# Patient Record
Sex: Male | Born: 1955 | Race: Black or African American | Hispanic: No | Smoking: Former smoker
Health system: Southern US, Community
[De-identification: ages and names within clinical notes are randomized; demographics above are authoritative.]

## PROBLEM LIST (undated history)

## (undated) DIAGNOSIS — I10 Essential (primary) hypertension: Secondary | ICD-10-CM

## (undated) DIAGNOSIS — E119 Type 2 diabetes mellitus without complications: Secondary | ICD-10-CM

---

## 2022-01-01 ENCOUNTER — Inpatient Hospital Stay (HOSPITAL_COMMUNITY)
Admission: EM | Admit: 2022-01-01 | Discharge: 2022-01-09 | DRG: 065 | Disposition: A | Discharge status: Rehabilitation Facility | Payer: Medicaid - Out of State | Attending: Internal Medicine | Admitting: Internal Medicine

## 2022-01-01 ENCOUNTER — Encounter (HOSPITAL_COMMUNITY): Payer: Self-pay

## 2022-01-01 ENCOUNTER — Emergency Department (HOSPITAL_COMMUNITY): Payer: Medicaid - Out of State

## 2022-01-01 ENCOUNTER — Inpatient Hospital Stay (HOSPITAL_COMMUNITY): Payer: Medicaid - Out of State

## 2022-01-01 ENCOUNTER — Other Ambulatory Visit: Payer: Self-pay

## 2022-01-01 DIAGNOSIS — Z8249 Family history of ischemic heart disease and other diseases of the circulatory system: Secondary | ICD-10-CM | POA: Diagnosis not present

## 2022-01-01 DIAGNOSIS — E785 Hyperlipidemia, unspecified: Secondary | ICD-10-CM | POA: Diagnosis present

## 2022-01-01 DIAGNOSIS — W1830XA Fall on same level, unspecified, initial encounter: Secondary | ICD-10-CM | POA: Diagnosis present

## 2022-01-01 DIAGNOSIS — L84 Corns and callosities: Secondary | ICD-10-CM | POA: Diagnosis present

## 2022-01-01 DIAGNOSIS — Z79899 Other long term (current) drug therapy: Secondary | ICD-10-CM | POA: Diagnosis not present

## 2022-01-01 DIAGNOSIS — I639 Cerebral infarction, unspecified: Secondary | ICD-10-CM | POA: Diagnosis present

## 2022-01-01 DIAGNOSIS — Z716 Tobacco abuse counseling: Secondary | ICD-10-CM

## 2022-01-01 DIAGNOSIS — B351 Tinea unguium: Secondary | ICD-10-CM | POA: Diagnosis present

## 2022-01-01 DIAGNOSIS — F17210 Nicotine dependence, cigarettes, uncomplicated: Secondary | ICD-10-CM | POA: Diagnosis present

## 2022-01-01 DIAGNOSIS — E1165 Type 2 diabetes mellitus with hyperglycemia: Secondary | ICD-10-CM

## 2022-01-01 DIAGNOSIS — Z794 Long term (current) use of insulin: Secondary | ICD-10-CM

## 2022-01-01 DIAGNOSIS — Z20822 Contact with and (suspected) exposure to covid-19: Secondary | ICD-10-CM | POA: Diagnosis present

## 2022-01-01 DIAGNOSIS — C61 Malignant neoplasm of prostate: Secondary | ICD-10-CM | POA: Diagnosis present

## 2022-01-01 DIAGNOSIS — Z7982 Long term (current) use of aspirin: Secondary | ICD-10-CM

## 2022-01-01 DIAGNOSIS — I10 Essential (primary) hypertension: Secondary | ICD-10-CM | POA: Diagnosis present

## 2022-01-01 DIAGNOSIS — R1312 Dysphagia, oropharyngeal phase: Secondary | ICD-10-CM | POA: Diagnosis present

## 2022-01-01 DIAGNOSIS — R29810 Facial weakness: Secondary | ICD-10-CM | POA: Diagnosis present

## 2022-01-01 DIAGNOSIS — I6381 Other cerebral infarction due to occlusion or stenosis of small artery: Secondary | ICD-10-CM | POA: Diagnosis present

## 2022-01-01 DIAGNOSIS — R471 Dysarthria and anarthria: Secondary | ICD-10-CM | POA: Diagnosis present

## 2022-01-01 DIAGNOSIS — I6389 Other cerebral infarction: Secondary | ICD-10-CM | POA: Diagnosis not present

## 2022-01-01 DIAGNOSIS — I69354 Hemiplegia and hemiparesis following cerebral infarction affecting left non-dominant side: Secondary | ICD-10-CM | POA: Diagnosis not present

## 2022-01-01 DIAGNOSIS — Z28310 Unvaccinated for covid-19: Secondary | ICD-10-CM

## 2022-01-01 HISTORY — DX: Essential (primary) hypertension: I10

## 2022-01-01 HISTORY — DX: Type 2 diabetes mellitus without complications: E11.9

## 2022-01-01 LAB — CBC
HCT: 43 % (ref 39.0–52.0)
Hemoglobin: 14.1 g/dL (ref 13.0–17.0)
MCH: 28.3 pg (ref 26.0–34.0)
MCHC: 32.8 g/dL (ref 30.0–36.0)
MCV: 86.2 fL (ref 80.0–100.0)
Platelets: 239 10*3/uL (ref 150–400)
RBC: 4.99 MIL/uL (ref 4.22–5.81)
RDW: 12.7 % (ref 11.5–15.5)
WBC: 13.6 10*3/uL — ABNORMAL HIGH (ref 4.0–10.5)
nRBC: 0 % (ref 0.0–0.2)

## 2022-01-01 LAB — COMPREHENSIVE METABOLIC PANEL
ALT: 8 U/L (ref 0–44)
AST: 19 U/L (ref 15–41)
Albumin: 3.5 g/dL (ref 3.5–5.0)
Alkaline Phosphatase: 133 U/L — ABNORMAL HIGH (ref 38–126)
Anion gap: 12 (ref 5–15)
BUN: 7 mg/dL — ABNORMAL LOW (ref 8–23)
CO2: 25 mmol/L (ref 22–32)
Calcium: 9.3 mg/dL (ref 8.9–10.3)
Chloride: 99 mmol/L (ref 98–111)
Creatinine, Ser: 0.93 mg/dL (ref 0.61–1.24)
GFR, Estimated: >60 mL/min (ref >60)
Glucose, Bld: 387 mg/dL — ABNORMAL HIGH (ref 70–99)
Potassium: 4.4 mmol/L (ref 3.5–5.1)
Sodium: 136 mmol/L (ref 135–145)
Total Bilirubin: 0.8 mg/dL (ref 0.3–1.2)
Total Protein: 6.5 g/dL (ref 6.5–8.1)

## 2022-01-01 LAB — RESP PANEL BY RT-PCR (FLU A&B, COVID) ARPGX2
Influenza A by PCR: NEGATIVE
Influenza B by PCR: NEGATIVE
SARS Coronavirus 2 by RT PCR: NEGATIVE

## 2022-01-01 LAB — I-STAT VENOUS BLOOD GAS, ED
Acid-Base Excess: 4 mmol/L — ABNORMAL HIGH (ref 0.0–2.0)
Bicarbonate: 29.3 mmol/L — ABNORMAL HIGH (ref 20.0–28.0)
Calcium, Ion: 0.9 mmol/L — ABNORMAL LOW (ref 1.15–1.40)
HCT: 42 % (ref 39.0–52.0)
Hemoglobin: 14.3 g/dL (ref 13.0–17.0)
O2 Saturation: 70 %
Potassium: 4.4 mmol/L (ref 3.5–5.1)
Sodium: 138 mmol/L (ref 135–145)
TCO2: 31 mmol/L (ref 22–32)
pCO2, Ven: 45.2 mmHg (ref 44.0–60.0)
pH, Ven: 7.419 (ref 7.250–7.430)
pO2, Ven: 37 mmHg (ref 32.0–45.0)

## 2022-01-01 LAB — CBG MONITORING, ED
Glucose-Capillary: 381 mg/dL — ABNORMAL HIGH (ref 70–99)
Glucose-Capillary: 322 mg/dL — ABNORMAL HIGH (ref 70–99)
Glucose-Capillary: 299 mg/dL — ABNORMAL HIGH (ref 70–99)

## 2022-01-01 MED ORDER — CLOPIDOGREL BISULFATE 75 MG PO TABS
75.0000 mg | ORAL_TABLET | Freq: Every day | ORAL | Status: DC
  Administered 2022-01-03 – 2022-01-09 (×7): 75 mg via ORAL
  Filled 2022-01-01 (×8): qty 1

## 2022-01-01 MED ORDER — STROKE: EARLY STAGES OF RECOVERY BOOK
Freq: Once | Status: AC
  Filled 2022-01-01: qty 1

## 2022-01-01 MED ORDER — ENOXAPARIN SODIUM 40 MG/0.4ML IJ SOSY
40.0000 mg | PREFILLED_SYRINGE | INTRAMUSCULAR | Status: DC
  Administered 2022-01-01 – 2022-01-08 (×8): 40 mg via SUBCUTANEOUS
  Filled 2022-01-01 (×7): qty 0.4

## 2022-01-01 MED ORDER — GADOBUTROL 1 MMOL/ML IV SOLN
8.5000 mL | Freq: Once | INTRAVENOUS | Status: AC | PRN
  Administered 2022-01-01: 8.5 mL via INTRAVENOUS

## 2022-01-01 MED ORDER — ACETAMINOPHEN 160 MG/5ML PO SOLN: 650.0000 mg | ORAL | Status: DC | PRN

## 2022-01-01 MED ORDER — IOHEXOL 350 MG/ML SOLN
80.0000 mL | Freq: Once | INTRAVENOUS | Status: AC | PRN
  Administered 2022-01-01: 80 mL via INTRAVENOUS

## 2022-01-01 MED ORDER — ACETAMINOPHEN 325 MG PO TABS: 650.0000 mg | ORAL_TABLET | ORAL | Status: DC | PRN

## 2022-01-01 MED ORDER — SODIUM CHLORIDE 0.9 % IV BOLUS
500.0000 mL | Freq: Once | INTRAVENOUS | Status: AC
  Administered 2022-01-01: 500 mL via INTRAVENOUS

## 2022-01-01 MED ORDER — ACETAMINOPHEN 650 MG RE SUPP: 650.0000 mg | RECTAL | Status: DC | PRN

## 2022-01-01 MED ORDER — ASPIRIN EC 81 MG PO TBEC
81.0000 mg | DELAYED_RELEASE_TABLET | Freq: Every day | ORAL | Status: DC
  Filled 2022-01-01: qty 1

## 2022-01-01 MED ORDER — INSULIN ASPART 100 UNIT/ML IJ SOLN
0.0000 [IU] | Freq: Every day | INTRAMUSCULAR | Status: DC
  Administered 2022-01-01 – 2022-01-02 (×2): 3 [IU] via SUBCUTANEOUS

## 2022-01-01 MED ORDER — INSULIN GLARGINE-YFGN 100 UNIT/ML ~~LOC~~ SOLN
20.0000 [IU] | Freq: Every day | SUBCUTANEOUS | Status: DC
  Administered 2022-01-01: 20 [IU] via SUBCUTANEOUS
  Filled 2022-01-01 (×3): qty 0.2

## 2022-01-01 MED ORDER — SENNOSIDES-DOCUSATE SODIUM 8.6-50 MG PO TABS: 1.0000 | ORAL_TABLET | Freq: Every evening | ORAL | Status: DC | PRN

## 2022-01-01 MED ORDER — NICOTINE 21 MG/24HR TD PT24
21.0000 mg | MEDICATED_PATCH | Freq: Every day | TRANSDERMAL | Status: DC
  Administered 2022-01-01 – 2022-01-09 (×9): 21 mg via TRANSDERMAL
  Filled 2022-01-01 (×9): qty 1

## 2022-01-01 MED ORDER — INSULIN ASPART 100 UNIT/ML IJ SOLN
0.0000 [IU] | Freq: Three times a day (TID) | INTRAMUSCULAR | Status: DC
  Administered 2022-01-02: 3 [IU] via SUBCUTANEOUS
  Administered 2022-01-02: 2 [IU] via SUBCUTANEOUS
  Administered 2022-01-03: 3 [IU] via SUBCUTANEOUS
  Administered 2022-01-03: 7 [IU] via SUBCUTANEOUS

## 2022-01-01 NOTE — ED Notes (Signed)
Patient transported to CT 

## 2022-01-01 NOTE — Consult Note (Signed)
Neurology Consultation  Reason for Consult: stroke on MRI brain.  Referring Physician: Kerby Less, MD.   CC: strokes on MRI brain.   History is obtained from: patient, chart.   HPI: George Reyes. is a 66 y.o. 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 Patient presents to [facility] ED for weakness and a fall. He states he felt weak (diffusely) this am when he went to void. He fell in the bathroom and woke up his girlfriend. No LOC. Denies hitting his head. His nephew came and got him up, then they called 911.   States he was confused a bit yesterday. Felt like his memory was not so good.   His CBG was over 500 en route, down to 322 here. He is insulin dependent, takes ASA 81mg  daily and Lipitor. Upon questioning of patient, he had no slurred speech, aphasia, vision changes, increased weakness of a particular limb, dysphagia, facial droop, or numbness/tingling.   In review of chart, notes are only from [facility] and are related to orthopaedic surgery. No other notes in chart.   Because of stroke on MRI brain, neurology was asked to consult.   ROS: A robust ROS was performed and is negative except as noted in the HPI.    Past Medical History:  Diagnosis Date   Diabetes mellitus without complication ([place])    Hypertension   Stroke with sequelae of left sided weakness HLD Prostate cancer  Family History  Problem Relation Age of Onset   Heart attack Mother    Heart attack Father    Cancer Father    Social History:   reports that he has quit smoking. His smoking use included cigarettes. He has never used smokeless tobacco. He reports that he does not currently use alcohol. He reports that he does not currently use drugs.  Medications  Current Facility-Administered Medications:     stroke: mapping our early stages of recovery book, , Does not apply, Once, Madalyn Rob, MD   acetaminophen  (TYLENOL) tablet 650 mg, 650 mg, Oral, Q4H PRN **OR** acetaminophen (TYLENOL) 160 MG/5ML solution 650 mg, 650 mg, Per Tube, Q4H PRN **OR** acetaminophen (TYLENOL) suppository 650 mg, 650 mg, Rectal, Q4H PRN, Madalyn Rob, MD   aspirin EC tablet 81 mg, 81 mg, Oral, Daily, Kirby-Graham, Karsten Fells, NP   clopidogrel (PLAVIX) tablet 75 mg, 75 mg, Oral, Daily, Kirby-Graham, Karsten Fells, NP   enoxaparin (LOVENOX) injection 40 mg, 40 mg, Subcutaneous, Q24H, Madalyn Rob, MD   insulin aspart (novoLOG) injection 0-5 Units, 0-5 Units, Subcutaneous, QHS, Madalyn Rob, MD   insulin aspart (novoLOG) injection 0-9 Units, 0-9 Units, Subcutaneous, TID WC, Madalyn Rob, MD   insulin glargine-yfgn (SEMGLEE) injection 20 Units, 20 Units, Subcutaneous, QHS, Madalyn Rob, MD   nicotine (NICODERM CQ - dosed in mg/24 hours) patch 21 mg, 21 mg, Transdermal, Daily, Madalyn Rob, MD   senna-docusate (Senokot-S) tablet 1 tablet, 1 tablet, Oral, QHS PRN, Madalyn Rob, MD  Current Outpatient Medications:    Ascorbic Acid (VITAMIN C PO), Take 1 tablet by mouth daily., Disp: , Rfl:    aspirin EC 81 MG tablet, Take 81 mg by mouth every evening. Swallow whole., Disp: , Rfl:    B Complex Vitamins (VITAMIN B-COMPLEX PO), Take 1 tablet by mouth daily., Disp: , Rfl:    ibuprofen (ADVIL) 200 MG tablet, Take 200 mg by mouth every 6 (six) hours as  needed for fever, headache or mild pain., Disp: , Rfl:    LANTUS SOLOSTAR 100 UNIT/ML Solostar Pen, Inject 10 Units into the skin every evening., Disp: , Rfl:    VITAMIN A PO, Take 1 tablet by mouth daily., Disp: , Rfl:    VITAMIN D PO, Take 1 capsule by mouth daily., Disp: , Rfl:    XTANDI 40 MG capsule, Take 120 mg by mouth every evening., Disp: , Rfl:    Exam: Current vital signs: BP (!) 154/88    Pulse (!) 104    Temp 98.7 F (37.1 C)    Resp 18    SpO2 97%  Vital signs in last 24 hours: Temp:  [98.7 F (37.1 C)] 98.7 F (37.1 C) (01/03 1031) Pulse Rate:  [95-110] 104 (01/03  1600) Resp:  [14-20] 18 (01/03 1600) BP: (125-165)/(83-119) 154/88 (01/03 1600) SpO2:  [95 %-100 %] 97 % (01/03 1600)  PE: GENERAL: Fairly well appearing male lying in ED bed in NAD. Awake and alert.   HEENT: normocephalic and atraumatic. LUNGS - Normal respiratory effort.  CV - RRR on tele. ABDOMEN - Soft, nontender. Ext: warm, well perfused. Psych: affect light.    NEURO:  Mental Status: Alert and oriented x4. Speech/Language: speech is without dysarthria or aphasia.  Naming, repetition, fluency, and comprehension intact.  Cranial Nerves:  II: PERRL 2 mm/brisk. visual fields full.  III, IV, VI: EOMI. Lid elevation symmetric and full.  V: sensation is intact and symmetrical to face.  VII: Smile is symmetrical. VIII:hearing intact to voice. IX, X: palate elevation is symmetric. Phonation normal.  XI: normal sternocleidomastoid and trapezius muscle strength. QIO:NGEXBM is symmetrical without fasciculations.   Motor:  RUE: 5 throughout LUE: grips  4/5      triceps  5/5      biceps   5/5 RLE:  5/5 throughout.  LLE: 5/5 throughout except dorsi/plantar flexion which is 4/5.  Tone is normal. Bulk is normal.  Sensation- Intact to light touch bilaterally in all four extremities. Extinction absent to DSS.  Coordination: FTN intact bilaterally. HKS intact. LUE drift. .  Gait- deferred.  LKW last night.  TNK-no, outside window.  MRS-3  NIHSS:  1a Level of Consciousness: 0 1b LOC Questions:0  1c LOC Commands: 0 2 Best Gaze: 0 3 Visual: 0 4 Facial Palsy: 0 5a Motor Arm - left: 0 5b Motor Arm - Right: 0 6a Motor Leg - Left: 0 6b Motor Leg - Right: 0 7 Limb Ataxia: 0 8 Sensory: 0 9 Best Language: 0 10 Dysarthria: 0 11 Extinction and Inattention: 0 TOTAL: 0  Labs I have reviewed labs in epic and the results pertinent to this consultation are:  CBC    Component Value Date/Time   WBC 13.6 (H) 01/01/2022 1117   RBC 4.99 01/01/2022 1117   HGB 14.3 01/01/2022 1125    HCT 42.0 01/01/2022 1125   PLT 239 01/01/2022 1117   MCV 86.2 01/01/2022 1117   MCH 28.3 01/01/2022 1117   MCHC 32.8 01/01/2022 1117   RDW 12.7 01/01/2022 1117    CMP     Component Value Date/Time   NA 138 01/01/2022 1125   K 4.4 01/01/2022 1125   CL 99 01/01/2022 1117   CO2 25 01/01/2022 1117   GLUCOSE 387 (H) 01/01/2022 1117   BUN 7 (L) 01/01/2022 1117   CREATININE 0.93 01/01/2022 1117   CALCIUM 9.3 01/01/2022 1117   PROT 6.5 01/01/2022 1117   ALBUMIN 3.5 01/01/2022 1117  AST 19 01/01/2022 1117   ALT 8 01/01/2022 1117   ALKPHOS 133 (H) 01/01/2022 1117   BILITOT 0.8 01/01/2022 1117   GFRNONAA >60 01/01/2022 1117   Imaging MD reviewed the images obtained.  CT head No acute intracranial process.  CTA head and neck ordered.   MRI brain 1. Acute infarct in the right pons. Slight edema without mass effect. 2. Additional remote pontine infarcts, moderate chronic microvascular ischemic disease, and cerebral atrophy (ICD10-G31.9). 3. Multiple chronic microhemorrhages, described above and likely secondary to chronic hypertension.  Assessment: 66 yo male with stroke risk factors of prior stroke, HTN, HLD, and IDDM II. Imaging showed acute right pons infarct. He will be admitted for stroke workup. Should his CTA head and neck show atherosclerosis, he will need 90 total days of Plavix with ASA, then ASA alone.   Recommendations/Plan:  -Medicine admit. - HgbA1c with goal < 7%.  -check fasting lipid panel. If LDL over 70, increase Atorvastatin.  -CTA head and neck with and without contrast.  - Frequent neuro checks - follow NIHSS - Echocardiogram -Plavix 75mg  po qd x 21 days (if atherosclerosis on CTA, give for 90 days). -ASA 81mg  qd x 21 or 90 days, then ASA alone.  - Risk factor modification - cardiac telemetry monitoring for arrhythmia - PT consult, OT consult, Speech consult - stroke education. - Stroke team to follow   Pt seen by Clance Boll, NP/Neuro and  later by MD. Note/plan to be edited by MD as needed.  Pager: [phone]   Attending Neurohospitalist Addendum Patient seen and examined Agree with the history and physical as documented above. Agree with the plan as documented, which I helped formulate. I have independently reviewed the chart, obtained history, review of systems and examined the patient.I have personally reviewed pertinent head/neck/spine imaging (CT/MRI). Please feel free to call with any questions.  Patient has baseline L sided weakness with slight drift LUE and some movement against gravity LLE. He feels that he is now back to his baseline. Admit for workup acute R pontine stroke  --  Su Monks, MD [facility] [phone]  If 7pm- 7am, please page neurology on call as listed in [place].

## 2022-01-01 NOTE — H&P (Signed)
Date: 01/01/2022               Patient Name:  George Reyes. MRN: [id number]  DOB: 1956/02/14 Age / Sex: 66 y.o., male   PCP: Rosaura Carpenter, FNP         Medical Service: Internal Medicine Teaching Service         Attending Physician: Dr. Velna Ochs, MD    First Contact: Dr. Lorin Glass Pager: [phone]  Second Contact: Dr. Alfonse Spruce Pager: [phone]       After Hours (After 5p/  First Contact Pager: [phone]  weekends / holidays): Second Contact Pager: [phone]   Chief Complaint: Weakness, fall  History of Present Illness: George Reyes. is a 66 y.o. male with a PMHx of prostate cancer, type 2 DM on insulin, HLD, and prior CVA 10 years ago with residual left-sided weakness who presents to [place] today with chief complaint of weakness and a fall.  History was mainly provided by the patient.  He states that he started feeling weak this morning around 6 AM.  Describes this as diffuse weakness, same on both sides.  He subsequently fell but did not hit his head or injure any other part of his body.  Also has felt confused since yesterday.  Other complaints include an episode of shortness of breath yesterday after sitting up.  Denies numbness, tingling, headache, vision changes, hearing loss, chest pain, dysuria, hematuria, hematemesis, loss of consciousness, dizziness.  He lives out of town and seen by PCP Dr. Marcell Anger in [place].  Patient says that he had a stroke about 10 years ago and has had weakness on his left side ever since.  At that time, he presented with difficulty speaking.  Also endorses having all his teeth removed before being diagnosed with prostate cancer about 1 and half years ago.  No other complaints or concerns today.  Additional history from wife over the phone: Has been on therapy for prostate cancer. Started feeling weaker yesterday.  Review of Systems  Constitutional:  Negative for fever and malaise/fatigue.  HENT:  Negative for ear pain and tinnitus.   Eyes:   Negative for blurred vision and double vision.  Respiratory:  Negative for sputum production and shortness of breath.   Cardiovascular:  Negative for chest pain and leg swelling.  Gastrointestinal:  Negative for abdominal pain and diarrhea.  Musculoskeletal:  Positive for falls. Negative for myalgias.  Neurological:  Negative for speech change and focal weakness.  Psychiatric/Behavioral:  Negative for memory loss and substance abuse.    Meds:  Current Meds  Medication Sig   Ascorbic Acid (VITAMIN C PO) Take 1 tablet by mouth daily.   aspirin EC 81 MG tablet Take 81 mg by mouth every evening. Swallow whole.   B Complex Vitamins (VITAMIN B-COMPLEX PO) Take 1 tablet by mouth daily.   ibuprofen (ADVIL) 200 MG tablet Take 200 mg by mouth every 6 (six) hours as needed for fever, headache or mild pain.   LANTUS SOLOSTAR 100 UNIT/ML Solostar Pen Inject 10 Units into the skin every evening.   VITAMIN A PO Take 1 tablet by mouth daily.   VITAMIN D PO Take 1 capsule by mouth daily.   XTANDI 40 MG capsule Take 120 mg by mouth every evening.   Allergies: Allergies as of 01/01/2022   (No Known Allergies)   Past Medical History:  Diagnosis Date   Diabetes mellitus without complication ([place])    Hypertension  Family History: Mother and father both had heart attacks.  He states that his father had "some type of cancer," but does not know which kind.  Social History: He lives in [place], [place]. he has performed [employer] his whole life.  He is now retired and lives with his wife.  Does not exercise regularly.  Tobacco use 1ppd since a teenager, no alcohol. No drug use.   Review of Systems: A complete ROS was negative except as per HPI.   Physical Exam: Blood pressure (!) 134/43, pulse 87, temperature 98.7 F (37.1 C), resp. rate (!) 21, SpO2 100 %. Physical Exam Constitutional:      General: He is not in acute distress.    Appearance: Normal appearance.  HENT:     Head: Normocephalic and  atraumatic.  Eyes:     Extraocular Movements:     Right eye: Normal extraocular motion.     Pupils: Pupils are equal, round, and reactive to light.  Cardiovascular:     Rate and Rhythm: Regular rhythm. Tachycardia present.     Heart sounds: No murmur heard.   No friction rub. No gallop.  Pulmonary:     Effort: Pulmonary effort is normal.     Breath sounds: Normal breath sounds. No wheezing, rhonchi or rales.  Abdominal:     General: Abdomen is flat. There is no distension.     Palpations: Abdomen is soft.     Tenderness: There is no abdominal tenderness.  Skin:    General: Skin is warm and dry.  Neurological:     Mental Status: He is alert and oriented to person, place, and time.  Psychiatric:        Mood and Affect: Mood normal.        Behavior: Behavior normal.   Neurologic exam: Mental status: A&Ox3 Cranial Nerves:             II: PERRL             III, IV, VI: Extra-occular motions intact bilaterally             V, VII: Face symmetric, sensation intact in all 3 divisions               VIII: Hearing normal to rubbing fingers bilaterally               IX, X: Palate rises symmetrically             XI: Shoulder shrug normal bilaterally               XII: Tongue midline    Motor: Strength 5/5 on upper extremities, bulk muscle and tone are normal. Able to slide heels on the bed, Strength 3/5 BLE Sensory: Light touch intact and symmetric bilaterally  Coordination: There is some dysmetria on finger-to-nose on the left side.  EKG: personally reviewed my interpretation is sinus tachycardia  CXR: personally reviewed my interpretation is no evidence of acute cardiopulmonary disease  CT Head without Contrast: IMPRESSION: No acute intracranial process  MRI Brain W/WO Contrast:  IMPRESSION: 1. Acute infarct in the right pons. Slight edema without mass effect. 2. Additional remote pontine infarcts, moderate chronic microvascular ischemic disease, and cerebral atrophy  (ICD10-G31.9). 3. Multiple chronic microhemorrhages, described above and likely secondary to chronic hypertension.   Assessment & Plan by Problem: Principal Problem:   CVA (cerebral vascular accident) ([place])  George Shook Geremy Rister. is a 66 y.o. male with a past medical history of CVA with residual  left-sided weakness ~10 years ago, type 2 diabetes on insulin, HLD, and prostate cancer who is admitted for acute ischemic stroke of the right pons.  #Acute Ischemic Stroke  The patient has a history of prior stroke about 10 years ago and notes that he recently started an anticholesterol medication (unsure what is called) about a month ago.  Other past medical history includes type 2 diabetes on insulin (Lantus 20 units nightly).  He has also smoked a pack a day since he was a teenager.  In the ED, blood pressure was elevated to 165/119 initially, the patient denies personal history of hypertension.  Other notable work-up includes WBC 13.6, CBG of 381, negative head CT but MRI brain showing an acute infarct in the right pons as well as additional remote pontine infarcts. Outside of tPA window. Neurology team is on board, and we will follow their recommendations and perform further work-up as below.  - Neurology on board, appreciate their recs - CTAHead and neck pending - TTE pending  - DAPT for 90 days, followed by ASA alone - Atorvastatin 80 mg daily - BP goal: permissive HTN up to 220/120 mmHg - A1c and Lipid profile pending - Telemetry - Frequent neuro checks - NPO until passes stroke swallow screen - PT/OT/ST eval  #T2DM The patient has a history of type 2 diabetes for which he is on Lantus 20 units nightly at home. Most recent A1c in our system is 14.7 from November 2019. - Glargine 20 U nightly with SSI, QHS and AC - CBG monitoring  #HLD - Continue atorvastatin as above  #Tobacco use - Nicotine patch  Dispo: Admit patient to Observation with expected length of stay less than 2  midnights.  Signed: Orvis Brill, MD 01/01/2022, 5:43 PM  Pager: [phone]  After 5pm on weekdays and 1pm on weekends: On Call pager: [phone]

## 2022-01-01 NOTE — ED Provider Notes (Signed)
[facility] EMERGENCY DEPARTMENT Provider Note   CSN: [id number] Arrival date & time: 01/01/22  1024     History  Chief Complaint  Patient presents with   Cerebrovascular Accident    George Scarlet. is a 66 y.o. male.  HPI Initial history obtained from air care 66 year old male who is reported to have weakness for 2 days.  Brunilda Payor reports that they were called to transport patient from the scene.  They presented to [facility] at the patient's home.  They report that he had had weakness for 2 days with some garbled speech.  They report that he fell sometime in the past 24 hours.  They were called by local EMS to transport the patient to [facility].  They were unable to transport the patient to [facility] due to weather.  They transported the patient [facility] [place] [facility] due to [facility] problems.  He was transported by ground from [place] [facility] [place] to [facility] emergency department. During transport, patient's blood sugar reported to be high in the 500s   Patient states that he fell this morning and he does not have an injury.  He reports that he lives at home.  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  He has no complaints of headache, head injury, neck pain, chest pain or nausea, vomiting, diarrhea, or weakness.  He does states he has had some nasal congestion and coughing.  He reports that he has a history of prostate cancer and is currently being treated for this.  He has not had COVID or been vaccinated. Home Medications Prior to Admission medications   Medication Sig Start Date End Date Taking? Authorizing Provider  Ascorbic Acid (VITAMIN C PO) Take 1 tablet by mouth daily.   Yes [provider]  aspirin EC 81 MG tablet Take 81 mg by mouth every evening. Swallow whole.   Yes [provider]  B Complex Vitamins (VITAMIN B-COMPLEX PO) Take 1 tablet by mouth daily.    Yes [provider]  ibuprofen (ADVIL) 200 MG tablet Take 200 mg by mouth every 6 (six) hours as needed for fever, headache or mild pain.   Yes [provider]  LANTUS SOLOSTAR 100 UNIT/ML Solostar Pen Inject 10 Units into the skin every evening. 11/11/21  Yes [provider]  VITAMIN A PO Take 1 tablet by mouth daily.   Yes [provider]  VITAMIN D PO Take 1 capsule by mouth daily.   Yes [provider]  XTANDI 40 MG capsule Take 120 mg by mouth every evening. 12/03/21  Yes [provider]     Prostate cancer T2dm Long acting insulin  Allergies    Patient has no known allergies.    Review of Systems   Review of Systems  All other systems reviewed and are negative.  Physical Exam Updated Vital Signs BP 128/83    Pulse 98    Temp 98.7 F (37.1 C)    Resp 20    SpO2 100%  Physical Exam Vitals and nursing note reviewed.  Constitutional:      General: He is not in acute distress.    Appearance: Normal appearance. He is not ill-appearing.  HENT:     Head: Normocephalic.     Right Ear: External ear normal.     Left Ear: External ear normal.     Nose: Nose normal.     Mouth/Throat:  Comments: Mucous membranes appear dry Eyes:     Extraocular Movements: Extraocular movements intact.     Pupils: Pupils are equal, round, and reactive to light.  Cardiovascular:     Rate and Rhythm: Normal rate and regular rhythm.     Pulses: Normal pulses.  Pulmonary:     Effort: Pulmonary effort is normal.  Abdominal:     General: Abdomen is flat.     Palpations: Abdomen is soft.  Musculoskeletal:        General: Normal range of motion.     Cervical back: Normal range of motion and neck supple.  Skin:    General: Skin is warm and dry.     Capillary Refill: Capillary refill takes less than 2 seconds.  Neurological:     Mental Status: He is alert. Mental status is at baseline.     Comments: Mild facial asymmetry noted but no obvious  deficit No palmar drift noted Left leg is able to be held up against gravity for count of 5 with right lower extremity held up for count of 10 Patient states this is at baseline since prior stroke  Psychiatric:        Mood and Affect: Mood normal.        Behavior: Behavior normal.    ED Results / Procedures / Treatments   Labs (all labs ordered are listed, but only abnormal results are displayed) Labs Reviewed  CBC - Abnormal; Notable for the following components:      Result Value   WBC 13.6 (*)    All other components within normal limits  COMPREHENSIVE METABOLIC PANEL - Abnormal; Notable for the following components:   Glucose, Bld 387 (*)    BUN 7 (*)    Alkaline Phosphatase 133 (*)    All other components within normal limits  CBG MONITORING, ED - Abnormal; Notable for the following components:   Glucose-Capillary 381 (*)    All other components within normal limits  I-STAT VENOUS BLOOD GAS, ED - Abnormal; Notable for the following components:   Bicarbonate 29.3 (*)    Acid-Base Excess 4.0 (*)    Calcium, Ion 0.90 (*)    All other components within normal limits  CBG MONITORING, ED - Abnormal; Notable for the following components:   Glucose-Capillary 322 (*)    All other components within normal limits  RESP PANEL BY RT-PCR (FLU A&B, COVID) [id number]  URINALYSIS, ROUTINE W REFLEX MICROSCOPIC  BLOOD GAS, VENOUS    EKG None  Radiology CT Head Wo Contrast  Result Date: 01/01/2022 CLINICAL DATA:  Ground level fall, slurred speech, left-sided weakness EXAM: CT HEAD WITHOUT CONTRAST TECHNIQUE: Contiguous axial images were obtained from the base of the skull through the vertex without intravenous contrast. COMPARISON:  None. FINDINGS: Brain: No evidence of acute infarction, hemorrhage, cerebral edema, mass, mass effect, or midline shift. No hydrocephalus or extra-axial fluid collection. Vascular: No hyperdense vessel. Atherosclerotic calcifications in the intracranial carotid  and vertebral arteries. Skull: Normal. Negative for fracture or focal lesion. Sinuses/Orbits: Mucous retention cyst in the right maxillary sinus. Otherwise negative. The orbits are unremarkable. Other: The mastoid air cells are well aerated. IMPRESSION: IMPRESSION No acute intracranial process. Electronically Signed   By: Merilyn Baba M.D.   On: 01/01/2022 11:46   MR Brain W and Wo Contrast  Result Date: 01/01/2022 CLINICAL DATA:  Neuro deficit, acute, stroke suspected EXAM: MRI HEAD WITHOUT AND WITH CONTRAST TECHNIQUE: Multiplanar, multiecho pulse sequences of the brain and surrounding structures  were obtained without and with intravenous contrast. CONTRAST:  8.42mL GADAVIST GADOBUTROL 1 MMOL/ML IV SOLN COMPARISON:  Same day CT head. FINDINGS: Brain: Acute infarct in the right pons. Slight edema without mass effect. Additional patchy T2/FLAIR hyperintensity within the supratentorial and pontine white matter, nonspecific but compatible with chronic microvascular ischemic disease. Cerebral atrophy with ex vacuo ventricular dilation. Remote pontine infarcts. Multiple small foci of susceptibility artifact in bilateral basal ganglia, left thalamus and pons, likely chronic microhemorrhages. No evidence of acute hemorrhage. No hydrocephalus, extra-axial fluid collection, mass lesion or midline shift. No abnormal enhancement Vascular: Major arterial flow voids are maintained at the skull base. Skull and upper cervical spine: Normal marrow signal. Sinuses/Orbits: Minimal paranasal sinus mucosal thickening. Unremarkable orbits. Other: No mastoid effusions. IMPRESSION: 1. Acute infarct in the right pons. Slight edema without mass effect. 2. Additional remote pontine infarcts, moderate chronic microvascular ischemic disease, and cerebral atrophy (ICD10-G31.9). 3. Multiple chronic microhemorrhages, described above and likely secondary to chronic hypertension. Electronically Signed   By: Margaretha Sheffield M.D.   On:  01/01/2022 14:41   DG Chest Port 1 View  Result Date: 01/01/2022 CLINICAL DATA:  weakness cough EXAM: PORTABLE CHEST 1 VIEW COMPARISON:  None. FINDINGS: No consolidation. No visible pleural effusions or pneumothorax. Cardiomediastinal silhouette is within limits. IMPRESSION: No evidence of acute cardiopulmonary disease. Electronically Signed   By: Margaretha Sheffield M.D.   On: 01/01/2022 11:09    Procedures .Critical Care Performed by: Pattricia Boss, MD Authorized by: Pattricia Boss, MD   Critical care provider statement:    Critical care time (minutes):  60   Critical care was necessary to treat or prevent imminent or life-threatening deterioration of the following conditions:  CNS failure or compromise   Critical care was time spent personally by me on the following activities:  Development of treatment plan with patient or surrogate, discussions with consultants, evaluation of patient's response to treatment, examination of patient, ordering and review of laboratory studies, ordering and review of radiographic studies, ordering and performing treatments and interventions, pulse oximetry, re-evaluation of patient's condition and review of old charts    Medications Ordered in ED Medications  sodium chloride 0.9 % bolus 500 mL (500 mLs Intravenous Bolus 01/01/22 1124)  gadobutrol (GADAVIST) 1 MMOL/ML injection 8.5 mL (8.5 mLs Intravenous Contrast Given 01/01/22 1429)    ED Course/ Medical Decision Making/ A&P Clinical Course as of 01/01/22 1510  Tue Jan 01, 2022  1453 MRI reviewed acute infarct right pons noted [DR]    Clinical Course User Index [DR] Pattricia Boss, MD                           Medical Decision Making Amount and/or Complexity of Data Reviewed Independent Historian: spouse and EMS    Details: 66 yo male ho prostate ca well until yesterday, states takes oral meds for prostate ca, yesterday at waking up with nasal congestion , cough, wife gave zytec and aspirin.  Overnigh  last nigh he got up and fell.  She felt that speech was slurred this am yesterday but worse today and she had difficulty understanding him.  She does not think he had any other symptoms. Labs: ordered. Decision-making details documented in ED Course. Radiology: ordered. Decision-making details documented in ED Course. ECG/medicine tests: ordered and independent interpretation performed. Decision-making details documented in ED Course. Discussion of management or test interpretation with external provider(s): Patient presents today with report of garbled speech and fall.  Work-up here significant for mild leukocytosis No definite focal deficits noted on exam although his left leg was somewhat weak compared to right MRI obtained and shows acute focal infarct Discussed with Dr. Quinn Axe, neurology Discussed with Dr. Court Joy, on call for im teaching service     Final Clinical Impression(s) / ED Diagnoses Final diagnoses:  Cerebral infarction, unspecified mechanism [facility])    Rx / DC Orders ED Discharge Orders     None         Pattricia Boss, MD 01/01/22 1510

## 2022-01-01 NOTE — ED Triage Notes (Signed)
From [place] family called EMS because pt had ground level fall and not acting himself at home slurred speech and Left sided weakness for 2 days then helicopter was in route to another hospital and had land because of bad weather. Pt alert and oriented

## 2022-01-01 NOTE — ED Notes (Signed)
Admitting MD at bedside.

## 2022-01-02 ENCOUNTER — Observation Stay (HOSPITAL_COMMUNITY): Payer: Medicaid - Out of State

## 2022-01-02 DIAGNOSIS — I6389 Other cerebral infarction: Secondary | ICD-10-CM | POA: Diagnosis not present

## 2022-01-02 DIAGNOSIS — I639 Cerebral infarction, unspecified: Secondary | ICD-10-CM

## 2022-01-02 DIAGNOSIS — E1165 Type 2 diabetes mellitus with hyperglycemia: Secondary | ICD-10-CM | POA: Diagnosis not present

## 2022-01-02 DIAGNOSIS — I69354 Hemiplegia and hemiparesis following cerebral infarction affecting left non-dominant side: Secondary | ICD-10-CM

## 2022-01-02 LAB — ECHOCARDIOGRAM COMPLETE
AR max vel: 3.56 cm2
AV Area VTI: 3.74 cm2
AV Area mean vel: 3.62 cm2
AV Mean grad: 3 mmHg
AV Peak grad: 4.5 mmHg
Ao pk vel: 1.06 m/s
Calc EF: 56.4 %
S' Lateral: 2.46 cm
Single Plane A2C EF: 60.6 %
Single Plane A4C EF: 50.1 %

## 2022-01-02 LAB — LIPID PANEL
Cholesterol: 275 mg/dL — ABNORMAL HIGH (ref 0–200)
HDL: 39 mg/dL — ABNORMAL LOW
LDL Cholesterol: 177 mg/dL — ABNORMAL HIGH (ref 0–99)
Total CHOL/HDL Ratio: 7.1 ratio
Triglycerides: 294 mg/dL — ABNORMAL HIGH
VLDL: 59 mg/dL — ABNORMAL HIGH (ref 0–40)

## 2022-01-02 LAB — RAPID URINE DRUG SCREEN, HOSP PERFORMED
Amphetamines: NOT DETECTED
Barbiturates: NOT DETECTED
Benzodiazepines: NOT DETECTED
Cocaine: NOT DETECTED
Opiates: NOT DETECTED
Tetrahydrocannabinol: NOT DETECTED

## 2022-01-02 LAB — URINALYSIS, ROUTINE W REFLEX MICROSCOPIC
Bilirubin Urine: NEGATIVE
Glucose, UA: >=500 mg/dL — AB
Hgb urine dipstick: NEGATIVE
Ketones, ur: 15 mg/dL — AB
Leukocytes,Ua: NEGATIVE
Nitrite: NEGATIVE
Protein, ur: NEGATIVE mg/dL
Specific Gravity, Urine: 1.01 (ref 1.005–1.030)
pH: 6.5 (ref 5.0–8.0)

## 2022-01-02 LAB — HEMOGLOBIN A1C
Hgb A1c MFr Bld: 13.7 % — ABNORMAL HIGH (ref 4.8–5.6)
Mean Plasma Glucose: 346.49 mg/dL

## 2022-01-02 LAB — GLUCOSE, CAPILLARY
Glucose-Capillary: 192 mg/dL — ABNORMAL HIGH (ref 70–99)
Glucose-Capillary: 286 mg/dL — ABNORMAL HIGH (ref 70–99)

## 2022-01-02 LAB — URINALYSIS, MICROSCOPIC (REFLEX)
Bacteria, UA: NONE SEEN
RBC / HPF: NONE SEEN RBC/hpf (ref 0–5)

## 2022-01-02 LAB — CBG MONITORING, ED
Glucose-Capillary: 227 mg/dL — ABNORMAL HIGH (ref 70–99)
Glucose-Capillary: 262 mg/dL — ABNORMAL HIGH (ref 70–99)

## 2022-01-02 LAB — HIV ANTIBODY (ROUTINE TESTING W REFLEX): HIV Screen 4th Generation wRfx: NONREACTIVE

## 2022-01-02 MED ORDER — FOOD THICKENER (SIMPLYTHICK HONEY)
10.0000 | ORAL | Status: DC | PRN
  Filled 2022-01-02: qty 10

## 2022-01-02 MED ORDER — INSULIN GLARGINE-YFGN 100 UNIT/ML ~~LOC~~ SOLN
25.0000 [IU] | Freq: Every day | SUBCUTANEOUS | Status: DC
  Administered 2022-01-02: 25 [IU] via SUBCUTANEOUS
  Filled 2022-01-02 (×2): qty 0.25

## 2022-01-02 MED ORDER — ATORVASTATIN CALCIUM 80 MG PO TABS
80.0000 mg | ORAL_TABLET | Freq: Every day | ORAL | Status: DC
  Administered 2022-01-03 – 2022-01-09 (×7): 80 mg via ORAL
  Filled 2022-01-02 (×8): qty 1

## 2022-01-02 MED ORDER — LIVING WELL WITH DIABETES BOOK
Freq: Once | Status: DC
  Filled 2022-01-02: qty 1

## 2022-01-02 MED ORDER — INSULIN GLARGINE-YFGN 100 UNIT/ML ~~LOC~~ SOLN: 30.0000 [IU] | Freq: Every day | SUBCUTANEOUS | Status: DC

## 2022-01-02 NOTE — Progress Notes (Signed)
° °  Echocardiogram 2D Echocardiogram has been performed.  George Reyes 01/02/2022, 2:08 PM

## 2022-01-02 NOTE — Evaluation (Signed)
Physical Therapy Evaluation Patient Details Name: George Reyes. MRN: [id number] DOB: 1956/11/21 Today's Date: 01/02/2022  History of Present Illness  Pt is a 66 y/o male admitted following fall and L sided weakness. Found to have R pons infarct. PMH includes prostate cancer, DM, CVA with L weakness.  Clinical Impression  Pt admitted secondary to problem above with deficits below. Pt requiring mod A +2 to perform bed mobility and transfers. Required physical assist to move RLE to take side steps as pt with difficulty sequencing and with weakness. Pt previously at a mod I level with ambulation using a cane. Recommending acute inpatient rehab level therapies at d/c to increase independence and safety. Will continue to follow acutely.      Recommendations for follow up therapy are one component of a multi-disciplinary discharge planning process, led by the attending physician.  Recommendations may be updated based on patient status, additional functional criteria and insurance authorization.  Follow Up Recommendations Acute inpatient rehab (3hours/day)    Assistance Recommended at Discharge Frequent or constant Supervision/Assistance  Patient can return home with the following  Help with stairs or ramp for entrance;Assistance with cooking/housework;Two people to help with bathing/dressing/bathroom;Two people to help with walking and/or transfers    Equipment Recommendations Wheelchair cushion (measurements PT);Wheelchair (measurements PT)  Recommendations for Other Services       Functional Status Assessment Patient has had a recent decline in their functional status and demonstrates the ability to make significant improvements in function in a reasonable and predictable amount of time.     Precautions / Restrictions Precautions Precautions: Fall Restrictions Weight Bearing Restrictions: No      Mobility  Bed Mobility Overal bed mobility: Needs Assistance Bed Mobility: Supine to  Sit;Sit to Supine     Supine to sit: Mod assist;+2 for physical assistance Sit to supine: Mod assist;+2 for physical assistance   General bed mobility comments: Assist for trunk and LE assist. Also required assist to scoot hips to EOB    Transfers Overall transfer level: Needs assistance Equipment used: 2 person hand held assist Transfers: Sit to/from Stand Sit to Stand: Mod assist;+2 physical assistance           General transfer comment: Mod A +2 for lift assist and steadying assist. Pt with difficulty taking side steps at EOB. Required manual assist with weightshift and OT had to lift LLE to take a step.    Ambulation/Gait                  Stairs            Wheelchair Mobility    Modified Rankin (Stroke Patients Only) Modified Rankin (Stroke Patients Only) Pre-Morbid Rankin Score: Slight disability Modified Rankin: Moderately severe disability     Balance Overall balance assessment: Needs assistance Sitting-balance support: No upper extremity supported;Feet supported Sitting balance-Leahy Scale: Fair     Standing balance support: Bilateral upper extremity supported Standing balance-Leahy Scale: Poor Standing balance comment: Reliant on BUE support and external support                             Pertinent Vitals/Pain Pain Assessment: No/denies pain Faces Pain Scale: No hurt    Home Living Family/patient expects to be discharged to:: Private residence Living Arrangements: Spouse/significant other Available Help at Discharge: Family;Available 24 hours/day Type of Home: House Home Access: Stairs to enter Entrance Stairs-Rails: Right Entrance Stairs-Number of Steps: 4   Home  Layout: One level Home Equipment: Cane - single point;Toilet riser      Prior Function Prior Level of Function : Needs assist             Mobility Comments: Uses cane for ambulation ADLs Comments: Girlfriend supervises for bathing     Hand  Dominance   Dominant Hand: Right    Extremity/Trunk Assessment   Upper Extremity Assessment Upper Extremity Assessment: Defer to OT evaluation    Lower Extremity Assessment Lower Extremity Assessment: LLE deficits/detail LLE Deficits / Details: Grossly 2+/5 throughout. Reporting numbness on outer part of L thigh    Cervical / Trunk Assessment Cervical / Trunk Assessment: Normal  Communication   Communication: No difficulties  Cognition Arousal/Alertness: Awake/alert Behavior During Therapy: WFL for tasks assessed/performed Overall Cognitive Status: No family/caregiver present to determine baseline cognitive functioning                                 General Comments: Slow processing noted. Pt reporting conflicting information throughout session Functional Status Assessment: Patient has had a recent decline in their functional status and demonstrates the ability to make significant improvements in function in a reasonable and predictable amount of time.      General Comments      Exercises     Assessment/Plan    PT Assessment Patient needs continued PT services  PT Problem List Decreased strength;Decreased activity tolerance;Decreased balance;Decreased mobility;Decreased knowledge of use of DME;Decreased knowledge of precautions;Decreased safety awareness;Decreased cognition       PT Treatment Interventions DME instruction;Gait training;Stair training;Therapeutic exercise;Therapeutic activities;Functional mobility training;Balance training;Cognitive remediation;Patient/family education;Neuromuscular re-education    PT Goals (Current goals can be found in the Care Plan section)  Acute Rehab PT Goals Patient Stated Goal: to get rehab before going home PT Goal Formulation: With patient Time For Goal Achievement: 01/16/22 Potential to Achieve Goals: Good    Frequency Min 4X/week     Co-evaluation PT/OT/SLP Co-Evaluation/Treatment: Yes Reason for  Co-Treatment: To address functional/ADL transfers;For patient/therapist safety PT goals addressed during session: Mobility/safety with mobility;Balance OT goals addressed during session: ADL's and self-care       AM-PAC PT "6 Clicks" Mobility  Outcome Measure Help needed turning from your back to your side while in a flat bed without using bedrails?: A Lot Help needed moving from lying on your back to sitting on the side of a flat bed without using bedrails?: Total Help needed moving to and from a bed to a chair (including a wheelchair)?: Total Help needed standing up from a chair using your arms (e.g., wheelchair or bedside chair)?: Total Help needed to walk in hospital room?: Total Help needed climbing 3-5 steps with a railing? : Total 6 Click Score: 7    End of Session Equipment Utilized During Treatment: Gait belt Activity Tolerance: Patient tolerated treatment well Patient left: in bed;with call bell/phone within reach (on stretcher in ED) Nurse Communication: Mobility status PT Visit Diagnosis: Unsteadiness on feet (R26.81);Muscle weakness (generalized) (M62.81)    Time: 2706-2376 PT Time Calculation (min) (ACUTE ONLY): 23 min   Charges:   PT Evaluation $PT Eval Moderate Complexity: 1 Mod          Reuel Derby, PT, DPT  Acute Rehabilitation Services  Pager: [phone] Office: [phone]   Rudean Hitt 01/02/2022, 11:32 AM

## 2022-01-02 NOTE — Evaluation (Signed)
Clinical/Bedside Swallow Evaluation Patient Details  Name: George Reyes. MRN: [id number] Date of Birth: 1956-10-12  Today's Date: 01/02/2022 Time: SLP Start Time (ACUTE ONLY): 1003 SLP Stop Time (ACUTE ONLY): 1017 SLP Time Calculation (min) (ACUTE ONLY): 14 min  Past Medical History:  Past Medical History:  Diagnosis Date   Diabetes mellitus without complication ([place])    Hypertension    Past Surgical History: History reviewed. No pertinent surgical history. HPI:  George Duman. is a 66 y.o. male who presented to [facility] ED for weakness and a fall. He states he felt weak (diffusely) this am when he went to void. He fell in the bathroom and woke up his girlfriend. No LOC. Denies hitting his head.  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    Assessment / Plan / Recommendation  Clinical Impression  Pt presents with clinical indicators of pharyngeal dysphagia in setting of new R pontine CVA. Pt has hx of prior stroke with residual L weakness.  It is unclear if L facial droop is baseline or new onset with current CVA.  Pt is edentulous but states that he eats most foods at baseline as long as they are cooked soft.  Pt exhibited cough with thin liquid by straw with liquid wash for regular solid.  Swallow was at times seemingly delayed without cough reflex noted.  With regular solid there was prolonged oral phase and oral residue, mostly located on hard palate.  Location of CVA with increased risk for silent aspiration.    Recommend MBSS prior to initiation of PO diet.  MBS planned for later this date.  Pt with dysarthric speech noted.  He is largely intelligible, but has noted changed in speech.  When speaking with food in mouth, pt could not be understood well.  Consider placing orders for speech-language evaluation.    SLP Visit Diagnosis:  Dysphagia, unspecified (R13.10)    Aspiration Risk  Moderate aspiration risk    Diet Recommendation NPO   Medication Administration: Via alternative means    Other  Recommendations  Consider a speech-language and/or cognitive-linguistic evaluation   Recommendations for follow up therapy are one component of a multi-disciplinary discharge planning process, led by the attending physician.  Recommendations may be updated based on patient status, additional functional criteria and insurance authorization.  Follow up Recommendations  (TBD)      Assistance Recommended at Discharge  (TBD)  Functional Status Assessment Patient has had a recent decline in their functional status and demonstrates the ability to make significant improvements in function in a reasonable and predictable amount of time.  Frequency and Duration  (TBD)   (TBD)       Prognosis   TBD     Swallow Study   General HPI: George Reyes. is a 66 y.o. male who presented to [facility] ED for weakness and a fall. He states he felt weak (diffusely) this am when he went to void. He fell in the bathroom and woke up his girlfriend. No LOC. Denies hitting his head.  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 Type of Study: Bedside Swallow Evaluation Previous Swallow Assessment: None documented Diet  Prior to this Study: NPO Temperature Spikes Noted: No Respiratory Status: Room air History of Recent Intubation: No Behavior/Cognition: Cooperative;Alert;Pleasant mood Oral Cavity Assessment: Within Functional Limits Oral Care Completed by SLP: No Oral Cavity - Dentition: Edentulous Vision: Functional for self-feeding Self-Feeding Abilities: Able to feed self Patient Positioning: Upright in bed Baseline Vocal Quality: Normal Volitional Cough:  (Fair) Volitional Swallow: Unable  to elicit    Oral/Motor/Sensory Function Overall Oral Motor/Sensory Function: Moderate impairment Facial ROM: Reduced left Facial Symmetry: Abnormal symmetry right Lingual ROM: Within Functional Limits Lingual Symmetry: Within Functional Limits Lingual Strength: Reduced Velum: Within Functional Limits Mandible: Within Functional Limits   Ice Chips Ice chips: Not tested   Thin Liquid Thin Liquid: Impaired Presentation: Cup;Straw Pharyngeal  Phase Impairments: Suspected delayed Swallow;Cough - Immediate    Nectar Thick Nectar Thick Liquid: Not tested   Honey Thick Honey Thick Liquid: Not tested   Puree Puree: Within functional limits Presentation: Spoon   Solid     Solid: Impaired Oral Phase Functional Implications: Prolonged oral transit;Impaired mastication;Oral residue Pharyngeal Phase Impairments: Cough - Delayed      Celedonio Savage, MA, [place] Office: [phone] 01/02/2022,10:39 AM

## 2022-01-02 NOTE — ED Notes (Signed)
Speech notified that pt was not in the room when coming to get for evaluation will have to reschedule

## 2022-01-02 NOTE — Progress Notes (Signed)
Inpatient Diabetes Program Recommendations  AACE/ADA: New Consensus Statement on Inpatient Glycemic Control (2015)  Target Ranges:  Prepandial:   less than 140 mg/dL      Peak postprandial:   less than 180 mg/dL (1-2 hours)      Critically ill patients:  140 - 180 mg/dL   Lab Results  Component Value Date   GLUCAP 262 (H) 01/02/2022   HGBA1C 13.7 (H) 01/02/2022    Review of Glycemic Control  Latest Reference Range & Units 01/01/22 11:17 01/01/22 14:59 01/01/22 21:37 01/02/22 10:23 01/02/22 12:26  Glucose-Capillary 70 - 99 mg/dL 381 (H) 322 (H) 299 (H) 227 (H) 262 (H)   Diabetes history: DM 2 Outpatient Diabetes medications: Lantus 10 units daily Current orders for Inpatient glycemic control:  Novolog sensitive tid with meals and HS Semglee 20 units daily  Inpatient Diabetes Program Recommendations:    Agree with current orders.  Discussed A1C with patient of 13.7%.  He states that he has been taking insulin as ordered but was not taking oral DM agent.  Discussed importance of Glycemic control to prevent complications and how current A1C indicates uncontrolled DM. Told patient that MD's would likely adjust DM medications while he is in the hospital to optimize blood sugars as well.  He states that DM is managed by the [place].   Discussed importance of checking blood sugars, taking medications and goal blood sugars of 100-180 mg/dL.  Patient did not report any low blood sugars at home.   Will follow.  Note potential plans for Rehab.  Will follow.   Thanks,  Adah Perl, RN, BC-ADM Inpatient Diabetes Coordinator Pager [phone]  (8a-5p)

## 2022-01-02 NOTE — Evaluation (Signed)
Occupational Therapy Evaluation Patient Details Name: George Reyes. MRN: [id number] DOB: 1956-03-25 Today's Date: 01/02/2022   History of Present Illness George Cauble. is a 66 y.o. male who presented to the ED 1/3 after a fall and weakness while attempting to ambulate to the bathroom. 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 He lives in a 1 level home with 4 STE, with his s/o who can assist as needed. Upon evaluation pt required mod A for bed mobility and for sit<>Stand, and max A +2 for side stepping at the bed side. He currently requires up to max A +2 for OOB ADLs. He is currently limited by L weakness, impaired coordination, balance and cognition. He will benefit from OT acutely. Recommend CIR at d/c to progress towards his mod I baseline prior to d/c home.      Recommendations for follow up therapy are one component of a multi-disciplinary discharge planning process, led by the attending physician.  Recommendations may be updated based on patient status, additional functional criteria and insurance authorization.   Follow Up Recommendations  Acute inpatient rehab (3hours/day)    Assistance Recommended at Discharge    Patient can return home with the following A lot of help with walking and/or transfers;Two people to help with walking and/or transfers;A lot of help with bathing/dressing/bathroom;Two people to help with bathing/dressing/bathroom;Direct supervision/assist for medications management;Assist for transportation;Help with stairs or ramp for entrance    Functional Status Assessment  Patient has had a recent decline in their functional status and demonstrates the ability to make significant improvements in function in a reasonable and  predictable amount of time.  Equipment Recommendations  BSC/3in1;Tub/shower seat;Wheelchair (measurements OT);Wheelchair cushion (measurements OT) (RW)    Recommendations for Other Services Rehab consult     Precautions / Restrictions Precautions Precautions: Fall Restrictions Weight Bearing Restrictions: No      Mobility Bed Mobility Overal bed mobility: Needs Assistance Bed Mobility: Supine to Sit;Sit to Supine     Supine to sit: Mod assist;+2 for physical assistance Sit to supine: Mod assist;+2 for physical assistance   General bed mobility comments: Assist for trunk and LE assist. Also required assist to scoot hips to EOB    Transfers Overall transfer level: Needs assistance Equipment used: 2 person hand held assist Transfers: Sit to/from Stand Sit to Stand: Mod assist;+2 physical assistance           General transfer comment: Mod A +2 for lift assist and steadying assist. Pt with difficulty taking side steps at EOB. Required manual assist with weightshift and OT had to lift LLE to take a step.      Balance Overall balance assessment: Needs assistance Sitting-balance support: No upper extremity supported;Feet supported Sitting balance-Leahy Scale: Fair     Standing balance support: Bilateral upper extremity supported Standing balance-Leahy Scale: Poor Standing balance comment: Reliant on BUE support and external support                           ADL either performed or assessed with clinical judgement   ADL Overall ADL's : Needs assistance/impaired Eating/Feeding: Set up;Sitting   Grooming: Supervision/safety;Set up;Sitting   Upper Body Bathing: Supervision/ safety;Set up;Sitting  Lower Body Bathing: Maximal assistance;Sit to/from stand   Upper Body Dressing : Supervision/safety;Set up;Sitting   Lower Body Dressing: Maximal assistance;Sit to/from stand   Toilet Transfer: Maximal assistance;+2 for physical assistance;+2 for  safety/equipment;Ambulation;Stand-pivot   Toileting- Clothing Manipulation and Hygiene: Maximal assistance;+2 for safety/equipment;+2 for physical assistance;Sit to/from stand       Functional mobility during ADLs: Maximal assistance;+2 for physical assistance;+2 for safety/equipment General ADL Comments: pt with difficulty coordinating LLE when standing, required max A +2 for all OOB tasks. benefits from cues to initiate ans sequence through tasks     Vision Baseline Vision/History: 0 No visual deficits Ability to See in Adequate Light: 0 Adequate Patient Visual Report: No change from baseline Vision Assessment?: Vision impaired- to be further tested in functional context Additional Comments: difficult to obtain accurate assessment, pt not follow commands. initially not blinking to threat on L eye. Seemingly impaired L peripheral visual field - needs to be further assessed            Pertinent Vitals/Pain Pain Assessment: No/denies pain Faces Pain Scale: No hurt     Hand Dominance Right   Extremity/Trunk Assessment Upper Extremity Assessment Upper Extremity Assessment: LUE deficits/detail LUE Deficits / Details: impaired AROM, and globally 4/5 MMT throughout LUE. deminished sensation reported in distal LUE, ulnar aspect>radial. Able to use funcitonally with increased time and effort LUE Sensation: decreased light touch LUE Coordination: decreased fine motor;decreased gross motor   Lower Extremity Assessment Lower Extremity Assessment: Defer to PT evaluation LLE Deficits / Details: Grossly 2+/5 throughout. Reporting numbness on outer part of L thigh   Cervical / Trunk Assessment Cervical / Trunk Assessment: Normal   Communication Communication Communication: No difficulties   Cognition Arousal/Alertness: Awake/alert Behavior During Therapy: WFL for tasks assessed/performed Overall Cognitive Status: No family/caregiver present to determine baseline cognitive functioning                                  General Comments: Slow processing noted. Pt reporting conflicting information throughout session     General Comments  VSS on RA    Exercises     Shoulder Instructions      Home Living Family/patient expects to be discharged to:: Private residence Living Arrangements: Spouse/significant other Available Help at Discharge: Family;Available 24 hours/day Type of Home: House Home Access: Stairs to enter [employer] of Steps: 4 Entrance Stairs-Rails: Right Home Layout: One level     Bathroom Shower/Tub: Tub/shower unit;Walk-in shower   Bathroom Toilet: Standard     Home Equipment: Cane - single point;Toilet riser          Prior Functioning/Environment Prior Level of Function : Needs assist             Mobility Comments: Uses cane for ambulation ADLs Comments: Girlfriend supervises for bathing        OT Problem List: Decreased strength;Decreased range of motion;Decreased activity tolerance;Impaired balance (sitting and/or standing);Decreased coordination;Decreased cognition;Decreased safety awareness;Decreased knowledge of use of DME or AE;Decreased knowledge of precautions;Impaired UE functional use      OT Treatment/Interventions: Self-care/ADL training;Therapeutic exercise;DME and/or AE instruction;Therapeutic activities;Patient/family education;Balance training    OT Goals(Current goals can be found in the care plan section) Acute Rehab OT Goals Patient Stated Goal: did not state OT Goal Formulation: With patient Time For Goal Achievement: 01/16/22 Potential to Achieve Goals: Good ADL Goals Pt Will Perform Grooming: with modified independence;standing Pt Will Perform Lower Body Bathing:  with modified independence;sit to/from stand Pt Will Perform Lower Body Dressing: with modified independence;sit to/from stand Pt Will Transfer to Toilet: with modified independence;ambulating Additional ADL Goal #1: pt  will indep verbalized at least 3 fall prevention strategies to apply in the home setting  OT Frequency: Min 2X/week    Co-evaluation PT/OT/SLP Co-Evaluation/Treatment: Yes Reason for Co-Treatment: To address functional/ADL transfers;For patient/therapist safety PT goals addressed during session: Mobility/safety with mobility;Balance OT goals addressed during session: ADL's and self-care      AM-PAC OT "6 Clicks" Daily Activity     Outcome Measure Help from another person eating meals?: A Little Help from another person taking care of personal grooming?: A Little Help from another person toileting, which includes using toliet, bedpan, or urinal?: A Lot Help from another person bathing (including washing, rinsing, drying)?: A Lot Help from another person to put on and taking off regular upper body clothing?: A Little Help from another person to put on and taking off regular lower body clothing?: A Lot 6 Click Score: 15   End of Session Equipment Utilized During Treatment: Gait belt Nurse Communication: Mobility status  Activity Tolerance: Patient tolerated treatment well Patient left: in bed;with call bell/phone within reach  OT Visit Diagnosis: Unsteadiness on feet (R26.81);Other abnormalities of gait and mobility (R26.89);Muscle weakness (generalized) (M62.81);History of falling (Z91.81);Apraxia (R48.2);Hemiplegia and hemiparesis Hemiplegia - Right/Left: Left Hemiplegia - dominant/non-dominant: Non-Dominant Hemiplegia - caused by: Cerebral infarction                Time: 6834-1962 OT Time Calculation (min): 23 min Charges:  OT General Charges $OT Visit: 1 Visit OT Evaluation $OT Eval Moderate Complexity: 1 Mod  Yee Joss A Alianys Chacko 01/02/2022, 11:42 AM

## 2022-01-02 NOTE — Progress Notes (Signed)
Subjective: The patient was seen at bedside during rounds this AM. He does not endorse any new complaints today.  He says that his speech has been more garbled since the weakness began.  His left-sided weakness has been the same since his stroke 10 years ago.  He has T2DM for which he is on insulin at home.  He has tried metformin in the past but states that his other doctor took him off it a while ago because he did not like it.  He has not tried any other oral medications.  No other complaints or concerns today.  Objective:  Vital signs in last 24 hours: Vitals:   01/02/22 0100 01/02/22 0200 01/02/22 0400 01/02/22 0600  BP: (!) 170/93 (!) 141/70 (!) 167/95 (!) 158/104  Pulse: 95 (!) 103 (!) 102 (!) 108  Resp: (!) 21 20 18 17   Temp:      TempSrc:      SpO2: 98% 100% 98% 100%    Physical Exam Constitutional:      General: He is not in acute distress.    Appearance: Normal appearance.  HENT:     Head: Normocephalic and atraumatic.  Eyes:     Extraocular Movements:     Right eye: Normal extraocular motion.     Pupils: Pupils are equal, round, and reactive to light.  Cardiovascular:     Rate and Rhythm: Regular rate and rhythm.    Heart sounds: No murmur heard.   No friction rub. No gallop.  Pulmonary:     Effort: Pulmonary effort is normal.     Breath sounds: Normal breath sounds. No wheezing, rhonchi or rales.  Abdominal:     General: Abdomen is flat. There is no distension.     Palpations: Abdomen is soft.     Tenderness: There is no abdominal tenderness.  Skin:    General: Skin is warm and dry.  Neurological:     Mental Status: He is alert and oriented to person, place, and time.  Psychiatric:        Mood and Affect: Mood normal.        Behavior: Behavior normal.    Neurologic exam: Mental status: A&Ox3 Cranial Nerves:             II: PERRL             III, IV, VI: Extra-occular motions intact bilaterally             V: Sensation intact in all 3 divisions                IX, X: Palate rises symmetrically             XI: Shoulder shrug normal bilaterally               XII: Tongue midline    Motor: Strength 5/5 in right upper extremity, 4/5 in LUE (unchanged per patient). bulk muscle and tone are normal. Able to slide heels on the bed, Strength 2/5 in BLE. Sensory: Light touch intact and symmetric bilaterally  Coordination: There is some dysmetria on finger-to-nose on the left side.   Assessment/Plan:  Principal Problem:   CVA (cerebral vascular accident) ([place])  #Acute Ischemic Stroke  MRI brain on admit showed an acute infarct in the right pons as well as additional remote pontine infarcts. Pt with other risk factors including current smoker, poorly controlled DM, HLD, prior stroke. CT angio did not show significant stenoses. LDL 171. Pt did fail  swallow eval this AM, dysarthric on exam. Orders already placed for sppech-language eval. PT/OT recommending CIR. Neurology team is on board, and we will follow their recommendations and perform further work-up as below.  - Neurology on board, appreciate their recs - TTE pending  - DAPT for 90 days, followed by ASA alone - Atorvastatin 80 mg daily - BP goal: permissive HTN up to 220/120 mmHg - Telemetry - Frequent neuro checks - NPO until passes stroke swallow screen - Speech-language eval/barium swallow pending   #T2DM The patient has a history of type 2 diabetes for which he is on Lantus 20 units nightly at home. A1c of 13.7.  - Glargine 20 U nightly with SSI, QHS and AC  - CBG monitoring  #HLD - Continue atorvastatin as above   #Tobacco use - Nicotine patch  Prior to Admission Living Arrangement: Home Anticipated Discharge Location: CIR Dispo: Anticipated discharge pending medical workup, medical stability, and CIR placement  Orvis Brill, MD 01/02/2022, 7:47 AM Pager: [phone]  After 5pm on weekdays and 1pm on weekends: On Call pager [phone]

## 2022-01-02 NOTE — ED Notes (Signed)
Pt incontinent of stool and urine. Brief and linens changed. Primofit placed, will obtain urine sample when clean sample is available.

## 2022-01-02 NOTE — Progress Notes (Signed)
Inpatient Rehab Admissions Coordinator:  ? ?Per therapy recommendations,  patient was screened for CIR candidacy by Javad Salva, MS, CCC-SLP. At this time, Pt. Appears to be a a potential candidate for CIR. I will place   order for rehab consult per protocol for full assessment. Please contact me any with questions. ? ?Gabino Hagin, MS, CCC-SLP ?Rehab Admissions Coordinator  ?[phone] (celll) ?[phone] (office) ? ?

## 2022-01-02 NOTE — Progress Notes (Signed)
Speech Language Pathology Treatment: Dysphagia  Patient Details Name: George Reyes. MRN: [id number] DOB: 03-03-1956 Today's Date: 01/02/2022 Time: 7628-3151 SLP Time Calculation (min) (ACUTE ONLY): 16 min  Assessment / Plan / Recommendation Clinical Impression  Pt had bedside swallow eval this am with recs for NPO and MBS. Unfortunately, when transport arrive to take him to radiology he was out of the room for another procedure and missed the window for the swallow study.  Tonight his performance is slightly improved.  His left facial asymmetry appears improved since this am. He tolerated 4 ounces of pudding with no concern for aspiration.  Thin liquids elicited immediate cough. Honey thick liquids led to wet sounding voice.  Recommend allowing dysphagia 1 diet and PUDDING thick liquids tonight pending MBS next date. George Reyes agrees with plan.   HPI HPI: George Reyes. is a 66 y.o. male who presented to [facility] ED for weakness and a fall. He states he felt weak (diffusely) this am when he went to void. He fell in the bathroom and woke up his girlfriend. No LOC. Denies hitting his head.  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      SLP Plan  Continue with current plan of care      Recommendations for follow up therapy are one component of a multi-disciplinary discharge planning process, led by the attending physician.  Recommendations may be updated based on patient status, additional functional criteria and insurance authorization.    Recommendations  Diet recommendations: Dysphagia 1 (puree); -pudding thick liquids Liquids provided via: Teaspoon Medication Administration: Crushed with puree Postural Changes and/or Swallow Maneuvers: Seated upright 90 degrees                Plan: Continue with current plan of care          George Reyes, [place] CCC/SLP Acute Rehabilitation Services Office number [phone] Pager [phone]   Assunta Curtis  01/02/2022, 6:12 PM

## 2022-01-02 NOTE — Discharge Summary (Signed)
Name: George Reyes. MRN: [id number] DOB: 1956/12/20 66 y.o. PCP: Rosaura Carpenter, FNP  Date of Admission: 01/01/2022 10:24 AM Date of Discharge: 01/09/2022 Attending Physician: Velna Ochs, MD  Discharge Diagnosis: 1.  Acute ischemic stroke, right pontine; history of CVA 10 years ago 2.  Poorly controlled type 2 diabetes 3.  Hyperlipidemia 4.  Tobacco use, willing to quit  Discharge Medications: Allergies as of 01/09/2022   No Known Allergies      Medication List     STOP taking these medications    aspirin EC 81 MG tablet Replaced by: aspirin 81 MG chewable tablet   ibuprofen 200 MG tablet Commonly known as: ADVIL   Lantus SoloStar 100 UNIT/ML Solostar Pen Generic drug: insulin glargine   VITAMIN A PO   VITAMIN B-COMPLEX PO   VITAMIN C PO   VITAMIN D PO   Xtandi 40 MG capsule Generic drug: enzalutamide       TAKE these medications    acetaminophen 325 MG tablet Commonly known as: TYLENOL Take 2 tablets (650 mg total) by mouth every 4 (four) hours as needed for mild pain (or temp > 37.5 C (99.5 F)).   acetaminophen 160 MG/5ML solution Commonly known as: TYLENOL Place 20.3 mLs (650 mg total) into feeding tube every 4 (four) hours as needed for mild pain (or temp > 37.5 C (99.5 F)).   acetaminophen 650 MG suppository Commonly known as: TYLENOL Place 1 suppository (650 mg total) rectally every 4 (four) hours as needed for mild pain (or temp > 37.5 C (99.5 F)).   aspirin 81 MG chewable tablet Chew 1 tablet (81 mg total) by mouth daily. Replaces: aspirin EC 81 MG tablet   atorvastatin 80 MG tablet Commonly known as: LIPITOR Take 1 tablet (80 mg total) by mouth daily.   clopidogrel 75 MG tablet Commonly known as: PLAVIX Take 1 tablet (75 mg total) by mouth daily.   enoxaparin 40 MG/0.4ML injection Commonly known as: LOVENOX Inject 0.4 mLs (40 mg total) into the skin daily.   food thickener Liqd Commonly known as: SIMPLYTHICK  (HONEY/LEVEL 3/MODERATELY THICK) Take 10 packets by mouth as needed.   insulin aspart 100 UNIT/ML injection Commonly known as: novoLOG Inject 0-15 Units into the skin 3 (three) times daily with meals.   insulin aspart 100 UNIT/ML injection Commonly known as: novoLOG Inject 8 Units into the skin 3 (three) times daily with meals.   insulin glargine-yfgn 100 UNIT/ML injection Commonly known as: SEMGLEE Inject 0.33 mLs (33 Units total) into the skin at bedtime.   nicotine 21 mg/24hr patch Commonly known as: NICODERM CQ - dosed in mg/24 hours Place 1 patch (21 mg total) onto the skin daily.   senna-docusate 8.6-50 MG tablet Commonly known as: Senokot-S Take 1 tablet by mouth at bedtime as needed for mild constipation.        Disposition and follow-up:   George P Leys Jr. was discharged from [facility] in Good condition.  At the hospital follow up visit please address:  1.  Acute ischemic stroke: Likely secondary to small vessel disease.  The patient started dual antiplatelet therapy with aspirin and Plavix on 01/05.  He will continue these 2 medications until 1/26, when he will switch to only taking Plavix alone.  Given his difficulty swallowing on admit, we started dysphagia 1 diet on 1/5, and we administered his medications in crushed form. Explored option of giving Crestor sprinkle, however this medication was unable to be found  in the [facility] system and may be quite expensive for the patient. If patient develops difficulty swallowing, can consider cortrak placement for nutrition.  2. Diabetic Foot Wound, Onychomycosis  Large callus over the plantar aspect of his right foot that he reports is chronic, does not appear infected but needs debridement. He will need outpatient podiatry follow-up for this.  3.  Pending labs/ test needing follow-up: None  Follow-up Appointments:   Hospital Course by problem list: 1. Acute ischemic stroke, right pons:  The  patient has a history of prior stroke about 10 years ago and notes that he recently started an anticholesterol medication (unsure what is called) about a month ago.  Other past medical history includes type 2 diabetes on insulin (Lantus 20 units nightly).  He has also smoked a pack a day since he was a teenager.  The patient presented here due to history of weakness and a fall.  In the ED, blood pressure was elevated to 165/119 initially, the patient denied personal history of hypertension.  Other notable work-up includes WBC 13.6, CBG of 381, negative head CT but MRI brain showing an acute infarct in the right pons as well as additional remote pontine infarcts. Outside of tPA window.   Neurology team was consulted, and we followed their recommendations during his hospital stay: DAPT with ASA and Plavix was started. Allowed for permissive HTN for 48 hours. CT angio did not show significant stenoses. LDL 171. A1c of 13.7. Telemetry monitored and did not reveal findings of afib during hospital stay. TTE performed in the hospital was unremarkable. Thought to be likely secondary to small vessel disease.  PT/OT evaluated the patient and recommended CIR.  Main concern prior to eventual discharge to CIR was patient's dysphagia.  On admit, he was noted to be dysarthric with a slight facial droop.  He failed his swallow screen the same day and continued to be n.p.o. until 1/5.  Patient's barium swallow performed the day showed a few instances of silent aspiration but with overall fairly good control of boluses.  After barium swallow, patient did have an episode of emesis, but this did not recur and there were otherwise no issues.  On 1/6, the patient endorsed tolerating his dinner and breakfast without choking episodes.  A cortrak was initially considered but was not given because patient was tolerating p.o. intake well on 1/6. He continued to tolerate PO intake throughout his hospitalization.  In terms of medical  therapy, patient was able to tolerate crushed p.o. medications on 1/5.  This included his aspirin and Plavix.  According to [facility], patient received atorvastatin as well after transitioning to dysphagia 1 diet.  Explored option of giving Crestor sprinkle, however this medication was unable to be found in the [facility] system and may be quite expensive.   Plan is for patient to continue aspirin and Plavix for a total of 3 weeks until January 26th when he will switch to taking Plavix alone.  Diabetes managed as below.  High intensity statin.  Patient was counseled to quit smoking completely and he seemed agreeable to this during his hospitalization.   #Diabetic Foot Wound #Onychomycosis  Large callus over the plantar aspect of his right foot that he reports is chronic, does not appear infected but needs debridement. He will need outpatient podiatry follow-up for this.    #Poorly controlled type 2 diabetes The patient has a history of type 2 diabetes for which he is on Lantus 20 units nightly at home.  A1c of 13.7 on admit.  Patient was managed with nightly and mealtime insulin, and his CBGs were monitored throughout his hospital stay. Oral medications were considered but were held given his dysphagia.  #Hyperlipidemia Continued atorvastatin during this hospitalization when the patient was able to swallow.  #Tobacco use The patient endorses smoking 1 pack/day since he was a teenager.  On admit, he was placed on nicotine patch.  This was continued throughout his hospitalization.  He was also counseled on smoking cessation, to which he seemed agreeable.  Subjective: Patient evaluated at bedside this AM. He reports improvement in weakness, but he is not quite back to his baseline.  Discharge Exam:   BP 137/84 (BP Location: Right Arm)    Pulse 78    Temp 98 F (36.7 C)    Resp 18    Ht 6\' 2"  (1.88 m)    Wt 81.8 kg    SpO2 97%    BMI 23.15 kg/m  Physical Exam Constitutional:      General: He is not  in acute distress.    Appearance: Normal appearance.  HENT:     Head: Normocephalic and atraumatic.  Eyes:     Extraocular Movements:     Right eye: Normal extraocular motion.     Pupils: Pupils are equal, round, and reactive to light.  Cardiovascular:     Rate and Rhythm: Regular rate and rhythm.    Heart sounds: No murmur heard.   No friction rub. No gallop.  Pulmonary:     Effort: Pulmonary effort is normal.     Breath sounds: Normal breath sounds. No wheezing, rhonchi or rales.  Abdominal:     General: Abdomen is flat. There is no distension.     Palpations: Abdomen is soft.     Tenderness: There is no abdominal tenderness.  Skin:    General: Skin is warm and dry.  Neurological:     Mental Status: He is alert and oriented to person, place, and time.  Psychiatric:        Mood and Affect: Mood normal.        Behavior: Behavior normal.    Neurologic exam: Mental status: A&Ox3 Cranial Nerves:             II: PERRL             III, IV, VI: Extra-occular motions intact bilaterally             V, VII: Sensation intact in all 3 divisions, left facial droop             IX, X: Palate rises symmetrically             XI: Shoulder shrug normal bilaterally               XII: Tongue midline    Motor: Strength 5/5 in right upper extremity, 4/5 in LUE. Strength 4/5 in BLE. Bulk muscle and tone are normal.  Sensory: Light touch intact and symmetric bilaterally   Pertinent Labs, Studies, and Procedures:  CT ANGIO HEAD NECK W WO CM  Result Date: 01/01/2022 CLINICAL DATA:  Stroke on prior MRI, slurred speech EXAM: CT ANGIOGRAPHY HEAD AND NECK TECHNIQUE: Multidetector CT imaging of the head and neck was performed using the standard protocol during bolus administration of intravenous contrast. Multiplanar CT image reconstructions and MIPs were obtained to evaluate the vascular anatomy. Carotid stenosis measurements (when applicable) are obtained utilizing NASCET criteria, using the distal  internal carotid diameter  as the denominator. CONTRAST:  56mL OMNIPAQUE IOHEXOL 350 MG/ML SOLN COMPARISON:  No prior CTA, correlation is made with CT head 01/01/2022 FINDINGS: CT HEAD FINDINGS For noncontrast findings, please see same day CT head. CTA NECK FINDINGS Aortic arch: Two-vessel arch with a common origin of the brachiocephalic and left common carotid arteries. Imaged portion shows no evidence of aneurysm or dissection. No significant stenosis of the major arch vessel origins. Aortic atherosclerosis. Right carotid system: No evidence of dissection, stenosis (50% or greater) or occlusion. Calcified plaque at the bifurcation and in the proximal right ICA, which is not hemodynamically significant. Left carotid system: No evidence of dissection, stenosis (50% or greater) or occlusion. Calcified plaque at the bifurcation and in the proximal left ICA, which is not hemodynamically significant. Vertebral arteries: Right dominant, with diminutive left vertebral artery. Moderate narrowing at the origin of the left vertebral artery, secondary to calcified and noncalcified plaque. No evidence of dissection, other stenosis stenosis (50% or greater) or occlusion. Skeleton: No acute osseous abnormality. Other neck: Multiple subcentimeter hypoattenuating nodules in the left greater than right thyroid lobe. Upper chest: Negative. Review of the MIP images confirms the above findings CTA HEAD FINDINGS Anterior circulation: Both internal carotid arteries are patent to the termini, with mild narrowing in the bilateral cavernous carotids, secondary to calcified plaque. A1 segments patent. Normal anterior communicating artery. Anterior cerebral arteries are patent to their distal aspects. No M1 stenosis or occlusion. Normal MCA bifurcations. Distal MCA branches perfused and symmetric. Posterior circulation: Vertebral arteries patent to the vertebrobasilar junction without stenosis. Mild calcification in the left V4. Patent right  posteroinferior cerebral artery. The left PICA is not definitively visualized. Basilar patent to its distal aspect. Superior cerebellar arteries patent bilaterally. Bilateral P1 segments originate from the basilar artery. PCAs perfused to their distal aspects without stenosis. Diminutive right posterior communicating artery is visualized. The left posterior communicating artery is not definitively seen. Venous sinuses: As permitted by contrast timing, patent. Anatomic variants: None significant Review of the MIP images confirms the above findings IMPRESSION: 1. Moderate focal stenosis at the origin of the left vertebral artery, which is diminutive and non dominant. The left PICA is not definitively visualized. 2. Calcified plaque at the bifurcation and in the proximal bilateral ICAs, which is not hemodynamically significant. 3. Mild narrowing of the bilateral cavernous carotids, secondary to calcified plaque, which is unlikely to be hemodynamically significant. No intracranial large vessel occlusion. Electronically Signed   By: Merilyn Baba M.D.   On: 01/01/2022 19:19   CT Head Wo Contrast  Result Date: 01/01/2022 CLINICAL DATA:  Ground level fall, slurred speech, left-sided weakness EXAM: CT HEAD WITHOUT CONTRAST TECHNIQUE: Contiguous axial images were obtained from the base of the skull through the vertex without intravenous contrast. COMPARISON:  None. FINDINGS: Brain: No evidence of acute infarction, hemorrhage, cerebral edema, mass, mass effect, or midline shift. No hydrocephalus or extra-axial fluid collection. Vascular: No hyperdense vessel. Atherosclerotic calcifications in the intracranial carotid and vertebral arteries. Skull: Normal. Negative for fracture or focal lesion. Sinuses/Orbits: Mucous retention cyst in the right maxillary sinus. Otherwise negative. The orbits are unremarkable. Other: The mastoid air cells are well aerated. IMPRESSION: IMPRESSION No acute intracranial process. Electronically  Signed   By: Merilyn Baba M.D.   On: 01/01/2022 11:46   MR Brain W and Wo Contrast  Result Date: 01/01/2022 CLINICAL DATA:  Neuro deficit, acute, stroke suspected EXAM: MRI HEAD WITHOUT AND WITH CONTRAST TECHNIQUE: Multiplanar, multiecho pulse sequences of the brain  and surrounding structures were obtained without and with intravenous contrast. CONTRAST:  8.31mL GADAVIST GADOBUTROL 1 MMOL/ML IV SOLN COMPARISON:  Same day CT head. FINDINGS: Brain: Acute infarct in the right pons. Slight edema without mass effect. Additional patchy T2/FLAIR hyperintensity within the supratentorial and pontine white matter, nonspecific but compatible with chronic microvascular ischemic disease. Cerebral atrophy with ex vacuo ventricular dilation. Remote pontine infarcts. Multiple small foci of susceptibility artifact in bilateral basal ganglia, left thalamus and pons, likely chronic microhemorrhages. No evidence of acute hemorrhage. No hydrocephalus, extra-axial fluid collection, mass lesion or midline shift. No abnormal enhancement Vascular: Major arterial flow voids are maintained at the skull base. Skull and upper cervical spine: Normal marrow signal. Sinuses/Orbits: Minimal paranasal sinus mucosal thickening. Unremarkable orbits. Other: No mastoid effusions. IMPRESSION: 1. Acute infarct in the right pons. Slight edema without mass effect. 2. Additional remote pontine infarcts, moderate chronic microvascular ischemic disease, and cerebral atrophy (ICD10-G31.9). 3. Multiple chronic microhemorrhages, described above and likely secondary to chronic hypertension. Electronically Signed   By: Margaretha Sheffield M.D.   On: 01/01/2022 14:41   DG Chest Port 1 View  Result Date: 01/01/2022 CLINICAL DATA:  weakness cough EXAM: PORTABLE CHEST 1 VIEW COMPARISON:  None. FINDINGS: No consolidation. No visible pleural effusions or pneumothorax. Cardiomediastinal silhouette is within limits. IMPRESSION: No evidence of acute cardiopulmonary  disease. Electronically Signed   By: Margaretha Sheffield M.D.   On: 01/01/2022 11:09   ECHOCARDIOGRAM COMPLETE  Result Date: 01/02/2022    ECHOCARDIOGRAM REPORT   Patient Name:   Jamarion [place]. Date of Exam: 01/02/2022 Medical Rec #:  [id number]        Height: Accession #:    [id number]       Weight: Date of Birth:  1956-04-22        BSA: Patient Age:    55 years         BP:           117/90 mmHg Patient Gender: M                HR:           115 bpm. Exam Location:  Inpatient Procedure: 2D Echo, Cardiac Doppler and Color Doppler Indications:    stroke  History:        Patient has no prior history of Echocardiogram examinations.                 Risk Factors:Hypertension and Diabetes.  Sonographer:    Beryle Beams Referring Phys: [place]  1. Technically difficult study with poor visualization.  2. Left ventricular ejection fraction, by estimation, is 55 to 60%. The left ventricle has normal function. Left ventricular endocardial border not optimally defined to evaluate regional wall motion. There is mild concentric left ventricular hypertrophy. Left ventricular diastolic parameters are consistent with Grade I diastolic dysfunction (impaired relaxation).  3. Right ventricular systolic function is normal. The right ventricular size is normal.  4. The mitral valve is grossly normal. Trivial mitral valve regurgitation.  5. The aortic valve was not well visualized. Aortic valve regurgitation is not visualized. Aortic valve sclerosis is present, with no evidence of aortic valve stenosis. Comparison(s): No prior Echocardiogram. Conclusion(s)/Recommendation(s): No intracardiac source of embolism detected on this transthoracic study. Consider a transesophageal echocardiogram to exclude cardiac source of embolism if clinically indicated. FINDINGS  Left Ventricle: Left ventricular ejection fraction, by estimation, is 55 to 60%. The left ventricle has normal function. Left ventricular endocardial  border not  optimally defined to evaluate regional wall motion. The left ventricular internal cavity size was normal in size. There is mild concentric left ventricular hypertrophy. Left ventricular diastolic parameters are consistent with Grade I diastolic dysfunction (impaired relaxation). Right Ventricle: The right ventricular size is normal. Right vetricular wall thickness was not well visualized. Right ventricular systolic function is normal. Left Atrium: Left atrial size was normal in size. Right Atrium: Right atrial size was normal in size. Pericardium: There is no evidence of pericardial effusion. Mitral Valve: The mitral valve is grossly normal. There is mild thickening of the mitral valve leaflet(s). There is mild calcification of the mitral valve leaflet(s). Mild mitral annular calcification. Trivial mitral valve regurgitation. Tricuspid Valve: The tricuspid valve is normal in structure. Tricuspid valve regurgitation is trivial. Aortic Valve: The aortic valve was not well visualized. Aortic valve regurgitation is not visualized. Aortic valve sclerosis is present, with no evidence of aortic valve stenosis. Aortic valve mean gradient measures 3.0 mmHg. Aortic valve peak gradient measures 4.5 mmHg. Aortic valve area, by VTI measures 3.74 cm. Pulmonic Valve: The pulmonic valve was not well visualized. Aorta: The aortic root was not well visualized. Venous: The inferior vena cava was not well visualized. IAS/Shunts: The atrial septum is grossly normal.  LEFT VENTRICLE PLAX 2D LVIDd:         3.48 cm     Diastology LVIDs:         2.46 cm     LV e' medial:  8.08 cm/s LV PW:         1.05 cm     LV e' lateral: 9.48 cm/s LV IVS:        1.00 cm LVOT diam:     2.30 cm LV SV:         75 LVOT Area:     4.15 cm  LV Volumes (MOD) LV vol d, MOD A2C: 81.0 ml LV vol d, MOD A4C: 66.8 ml LV vol s, MOD A2C: 31.9 ml LV vol s, MOD A4C: 33.3 ml LV SV MOD A2C:     49.1 ml LV SV MOD A4C:     66.8 ml LV SV MOD BP:      42.3 ml RIGHT VENTRICLE  RV S prime:     17.70 cm/s TAPSE (M-mode): 1.6 cm LEFT ATRIUM           RIGHT ATRIUM LA diam:      3.20 cm RA Area:     12.10 cm LA Vol (A4C): 17.6 ml  AORTIC VALVE AV Area (Vmax):    3.56 cm AV Area (Vmean):   3.62 cm AV Area (VTI):     3.74 cm AV Vmax:           106.00 cm/s AV Vmean:          74.300 cm/s AV VTI:            0.201 m AV Peak Grad:      4.5 mmHg AV Mean Grad:      3.0 mmHg LVOT Vmax:         90.90 cm/s LVOT Vmean:        64.700 cm/s LVOT VTI:          0.181 m LVOT/AV VTI ratio: 0.90  AORTA Ao Root diam: 3.20 cm Ao Asc diam:  3.00 cm  SHUNTS Systemic VTI:  0.18 m Systemic Diam: 2.30 cm Gwyndolyn Kaufman MD Electronically signed by Gwyndolyn Kaufman MD Signature Date/Time: 01/02/2022/5:15:44 PM  Final     CBC Latest Ref Rng & Units 01/05/2022 01/04/2022 01/01/2022  WBC 4.0 - 10.5 K/uL 7.5 8.5 -  Hemoglobin 13.0 - 17.0 g/dL 12.8(L) 13.6 14.3  Hematocrit 39.0 - 52.0 % 39.0 40.0 42.0  Platelets 150 - 400 K/uL 235 230 -   BMP Latest Ref Rng & Units 01/06/2022 01/05/2022 01/04/2022  Glucose 70 - 99 mg/dL 132(H) 172(H) 145(H)  BUN 8 - 23 mg/dL 22 22 19   Creatinine 0.61 - 1.24 mg/dL 0.97 0.86 0.83  Sodium 135 - 145 mmol/L 136 137 136  Potassium 3.5 - 5.1 mmol/L 3.9 3.7 3.1(L)  Chloride 98 - 111 mmol/L 101 101 101  CO2 22 - 32 mmol/L 27 27 26   Calcium 8.9 - 10.3 mg/dL 8.9 8.8(L) 8.6(L)   CBG (last 3)  Recent Labs    01/08/22 1654 01/08/22 2125 01/09/22 0618  GLUCAP 150* 156* 119*   LDL 171 A1c 13.7   Signed: Orvis Brill, MD 01/09/2022, 8:09 AM   Pager: [phone]

## 2022-01-02 NOTE — ED Notes (Signed)
Speech, OT, PT working with pt.  Pt did not pass swallow eval for speech further testing needed

## 2022-01-02 NOTE — Progress Notes (Addendum)
STROKE TEAM PROGRESS NOTE   INTERVAL HISTORY No family at the bedside. Patient is hemodynamically stable. Patient states that he does not take any medication for his cholesterol but that he is using his insulin for his diabetes. PT and OT at the bedside to work with the patient.  MRI scan shows right paramedian pontine lacunar infarct and old lacune's.  CT angiogram shows no significant large vessel extracranial intracranial stenosis.  LDL cholesterol is elevated 177 mg percent and hemoglobin A1c at 13.7. Vitals:   01/02/22 0200 01/02/22 0400 01/02/22 0600 01/02/22 0700  BP: (!) 141/70 (!) 167/95 (!) 158/104 (!) 139/94  Pulse: (!) 103 (!) 102 (!) 108 (!) 104  Resp: 20 18 17 16   Temp:      TempSrc:      SpO2: 100% 98% 100% 100%   CBC:  Recent Labs  Lab 01/01/22 1117 01/01/22 1125  WBC 13.6*  --   HGB 14.1 14.3  HCT 43.0 42.0  MCV 86.2  --   PLT 239  --    Basic Metabolic Panel:  Recent Labs  Lab 01/01/22 1117 01/01/22 1125  NA 136 138  K 4.4 4.4  CL 99  --   CO2 25  --   GLUCOSE 387*  --   BUN 7*  --   CREATININE 0.93  --   CALCIUM 9.3  --    Lipid Panel:  Recent Labs  Lab 01/02/22 0249  CHOL 275*  TRIG 294*  HDL 39*  CHOLHDL 7.1  VLDL 59*  LDLCALC 177*   HgbA1c:  Recent Labs  Lab 01/02/22 0249  HGBA1C 13.7*   Urine Drug Screen: No results for input(s): LABOPIA, COCAINSCRNUR, LABBENZ, AMPHETMU, THCU, LABBARB in the last 168 hours.  Alcohol Level No results for input(s): ETH in the last 168 hours.  IMAGING past 24 hours CT ANGIO HEAD NECK W WO CM  Result Date: 01/01/2022 CLINICAL DATA:  Stroke on prior MRI, slurred speech EXAM: CT ANGIOGRAPHY HEAD AND NECK TECHNIQUE: Multidetector CT imaging of the head and neck was performed using the standard protocol during bolus administration of intravenous contrast. Multiplanar CT image reconstructions and MIPs were obtained to evaluate the vascular anatomy. Carotid stenosis measurements (when applicable) are obtained  utilizing NASCET criteria, using the distal internal carotid diameter as the denominator. CONTRAST:  55mL OMNIPAQUE IOHEXOL 350 MG/ML SOLN COMPARISON:  No prior CTA, correlation is made with CT head 01/01/2022 FINDINGS: CT HEAD FINDINGS For noncontrast findings, please see same day CT head. CTA NECK FINDINGS Aortic arch: Two-vessel arch with a common origin of the brachiocephalic and left common carotid arteries. Imaged portion shows no evidence of aneurysm or dissection. No significant stenosis of the major arch vessel origins. Aortic atherosclerosis. Right carotid system: No evidence of dissection, stenosis (50% or greater) or occlusion. Calcified plaque at the bifurcation and in the proximal right ICA, which is not hemodynamically significant. Left carotid system: No evidence of dissection, stenosis (50% or greater) or occlusion. Calcified plaque at the bifurcation and in the proximal left ICA, which is not hemodynamically significant. Vertebral arteries: Right dominant, with diminutive left vertebral artery. Moderate narrowing at the origin of the left vertebral artery, secondary to calcified and noncalcified plaque. No evidence of dissection, other stenosis stenosis (50% or greater) or occlusion. Skeleton: No acute osseous abnormality. Other neck: Multiple subcentimeter hypoattenuating nodules in the left greater than right thyroid lobe. Upper chest: Negative. Review of the MIP images confirms the above findings CTA HEAD FINDINGS Anterior circulation:  Both internal carotid arteries are patent to the termini, with mild narrowing in the bilateral cavernous carotids, secondary to calcified plaque. A1 segments patent. Normal anterior communicating artery. Anterior cerebral arteries are patent to their distal aspects. No M1 stenosis or occlusion. Normal MCA bifurcations. Distal MCA branches perfused and symmetric. Posterior circulation: Vertebral arteries patent to the vertebrobasilar junction without stenosis. Mild  calcification in the left V4. Patent right posteroinferior cerebral artery. The left PICA is not definitively visualized. Basilar patent to its distal aspect. Superior cerebellar arteries patent bilaterally. Bilateral P1 segments originate from the basilar artery. PCAs perfused to their distal aspects without stenosis. Diminutive right posterior communicating artery is visualized. The left posterior communicating artery is not definitively seen. Venous sinuses: As permitted by contrast timing, patent. Anatomic variants: None significant Review of the MIP images confirms the above findings IMPRESSION: 1. Moderate focal stenosis at the origin of the left vertebral artery, which is diminutive and non dominant. The left PICA is not definitively visualized. 2. Calcified plaque at the bifurcation and in the proximal bilateral ICAs, which is not hemodynamically significant. 3. Mild narrowing of the bilateral cavernous carotids, secondary to calcified plaque, which is unlikely to be hemodynamically significant. No intracranial large vessel occlusion. Electronically Signed   By: Merilyn Baba M.D.   On: 01/01/2022 19:19   CT Head Wo Contrast  Result Date: 01/01/2022 CLINICAL DATA:  Ground level fall, slurred speech, left-sided weakness EXAM: CT HEAD WITHOUT CONTRAST TECHNIQUE: Contiguous axial images were obtained from the base of the skull through the vertex without intravenous contrast. COMPARISON:  None. FINDINGS: Brain: No evidence of acute infarction, hemorrhage, cerebral edema, mass, mass effect, or midline shift. No hydrocephalus or extra-axial fluid collection. Vascular: No hyperdense vessel. Atherosclerotic calcifications in the intracranial carotid and vertebral arteries. Skull: Normal. Negative for fracture or focal lesion. Sinuses/Orbits: Mucous retention cyst in the right maxillary sinus. Otherwise negative. The orbits are unremarkable. Other: The mastoid air cells are well aerated. IMPRESSION: IMPRESSION No  acute intracranial process. Electronically Signed   By: Merilyn Baba M.D.   On: 01/01/2022 11:46   MR Brain W and Wo Contrast  Result Date: 01/01/2022 CLINICAL DATA:  Neuro deficit, acute, stroke suspected EXAM: MRI HEAD WITHOUT AND WITH CONTRAST TECHNIQUE: Multiplanar, multiecho pulse sequences of the brain and surrounding structures were obtained without and with intravenous contrast. CONTRAST:  8.101mL GADAVIST GADOBUTROL 1 MMOL/ML IV SOLN COMPARISON:  Same day CT head. FINDINGS: Brain: Acute infarct in the right pons. Slight edema without mass effect. Additional patchy T2/FLAIR hyperintensity within the supratentorial and pontine white matter, nonspecific but compatible with chronic microvascular ischemic disease. Cerebral atrophy with ex vacuo ventricular dilation. Remote pontine infarcts. Multiple small foci of susceptibility artifact in bilateral basal ganglia, left thalamus and pons, likely chronic microhemorrhages. No evidence of acute hemorrhage. No hydrocephalus, extra-axial fluid collection, mass lesion or midline shift. No abnormal enhancement Vascular: Major arterial flow voids are maintained at the skull base. Skull and upper cervical spine: Normal marrow signal. Sinuses/Orbits: Minimal paranasal sinus mucosal thickening. Unremarkable orbits. Other: No mastoid effusions. IMPRESSION: 1. Acute infarct in the right pons. Slight edema without mass effect. 2. Additional remote pontine infarcts, moderate chronic microvascular ischemic disease, and cerebral atrophy (ICD10-G31.9). 3. Multiple chronic microhemorrhages, described above and likely secondary to chronic hypertension. Electronically Signed   By: Margaretha Sheffield M.D.   On: 01/01/2022 14:41   DG Chest Port 1 View  Result Date: 01/01/2022 CLINICAL DATA:  weakness cough EXAM: PORTABLE CHEST 1  VIEW COMPARISON:  None. FINDINGS: No consolidation. No visible pleural effusions or pneumothorax. Cardiomediastinal silhouette is within limits.  IMPRESSION: No evidence of acute cardiopulmonary disease. Electronically Signed   By: Margaretha Sheffield M.D.   On: 01/01/2022 11:09    PHYSICAL EXAM  Temp:  [98.2 F (36.8 C)-98.9 F (37.2 C)] 98.2 F (36.8 C) (01/04 1434) Pulse Rate:  [87-108] 94 (01/04 1434) Resp:  [11-21] 18 (01/04 1434) BP: (117-170)/(43-116) 150/96 (01/04 1434) SpO2:  [97 %-100 %] 98 % (01/04 1434)   Physical Exam  Constitutional: Appears well-developed and well-nourished middle-age Caucasian male.  Psych: Affect appropriate to situation Eyes: No scleral injection HENT: No OP obstrucion MSK: no joint deformities.  Cardiovascular: Normal rate and regular rhythm.  Respiratory: Effort normal, non-labored breathing GI: Soft.  No distension. There is no tenderness.  Skin: WDI  Neuro: Mental Status: Patient is awake, alert, oriented to person, place, month, year, and situation. Patient is able to give a clear and coherent history. Slight dysarthria Cranial Nerves: II: Visual Fields are full. Pupils are equal, round, and reactive to light.   III,IV, VI: EOMI without ptosis or diploplia.  Saccadic dysmetria on lateral gaze left greater than right.  No nystagmus. V: Facial sensation is symmetric to temperature VII: Facial movement is symmetric resting and smiling VIII: Hearing is intact to voice X: Palate elevates symmetrically XI: Shoulder shrug is symmetric. XII: Tongue protrudes midline without atrophy or fasciculations.  Motor: LUE drift.  Mild weakness left grip and intrinsic hand muscles.  Orbits right over left upper extremity. LLE mild drift, plantar and dorsiflexion 4/5 RUE 5/5 throughout no drift RLE 5/5 no drift Sensory: Sensation is symmetric to light touch and temperature in the arms and legs. No extinction to DSS present.  Coordination: FNF and HKS are intact bilaterally  ASSESSMENT/PLAN Mr. George Reyes. is a 66 y.o. 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 Patient presents to [facility] ED for weakness and a fall. He states he felt weak this morning when he went to void. He fell in the bathroom and woke up his girlfriend. No LOC. Denies hitting his head. His nephew came and got him up, then they called 911. EMS reports that CBG was over 500 and he is insulin dependent. LDL is 177 and HgbA1C is 13.7. Atorvastatin 80mg  started and sliding scale insulin ordered. Imaging shows an acute infarct in the right pons and no mass effect noted.   Right pontine infarct Stroke:  Rt pontine infarct likely secondary small vessel disease with mild left hemiparesis  code Stroke -No acute abnormality.  CTA head & neck- Moderate focal stenosis at the origin of the left vertebral artery, which is diminutive and non dominant. Calcified plaque at the bifurcation at the proximal bilateral ICAs MRI  Acute infarct in the right pons. Slight edema without mass effect. Remote pontine infarcts. Microvascular ischemic disease. Chronic microhemorrhages 2D Echo pending LDL 177 HgbA1c 13.7 VTE prophylaxis - Lovenox aspirin 81 mg daily prior to admission, now on aspirin 81 mg daily and clopidogrel 75 mg daily for 3 weeks and then plavix alone.  Therapy recommendations:  CIR Disposition:  Pending  Hypertension Stable Permissive hypertension (OK if < 220/120) but gradually normalize in 5-7 days Long-term BP goal normotensive  Hyperlipidemia Home meds:  none LDL 177, goal < 70 Add atorvastatin 80mg   High intensity statin not indicated  Continue statin at discharge  Diabetes type II Uncontrolled Home meds:  Insulin HgbA1c 13.7, goal < 7.0 CBGs SSI Semglee 20u QHS Novolog TID, QHS  Other Stroke Risk Factors Cigarette smoker 1ppd, advised to stop smoking Nicotine patch order Hx stroke/TIA Stroke approx. 10 years ago  Other Active Problems Dysphagia Failed swallow screen Modified barium swallow by [facility] day  # 1  Patient seen and examined by NP/APP with MD. MD to update note as needed.   Janine Ores, DNP, FNP-BC [facility] Pager: [phone]  STROKE MD NOTE :  I have personally obtained history,examined this patient, reviewed notes, independently viewed imaging studies, participated in medical decision making and plan of care.ROS completed by me personally and pertinent positives fully documented  I have made any additions or clarifications directly to the above note. Agree with note above.  Patient presented with left leg weakness secondary to right paramedian pontine infarct from small vessel disease.  Recommend aspirin Plavix for 3 weeks followed by Plavix alone and aggressive risk factor modification.  Patient counseled to quit smoking completely and seems agreeable.  Check echocardiogram, physical and Occupational Therapy consults and will likely need transfer to inpatient rehab.  Discussed with patient and girlfriend and answered questions.  Greater than 50% time during this 50-minute visit was spent in counseling and coordination of care about his lacunar stroke and discussion about stroke prevention and treatment and answering questions.  Antony Contras, MD Medical Director [facility] Pager: [phone] 01/02/2022 3:39 PM   To contact Stroke Continuity provider, please refer to [email or web address] After hours, contact General Neurology

## 2022-01-03 ENCOUNTER — Inpatient Hospital Stay (HOSPITAL_COMMUNITY): Payer: Medicaid - Out of State

## 2022-01-03 DIAGNOSIS — E1165 Type 2 diabetes mellitus with hyperglycemia: Secondary | ICD-10-CM | POA: Diagnosis not present

## 2022-01-03 DIAGNOSIS — I639 Cerebral infarction, unspecified: Secondary | ICD-10-CM

## 2022-01-03 LAB — GLUCOSE, CAPILLARY
Glucose-Capillary: 224 mg/dL — ABNORMAL HIGH (ref 70–99)
Glucose-Capillary: 321 mg/dL — ABNORMAL HIGH (ref 70–99)
Glucose-Capillary: 258 mg/dL — ABNORMAL HIGH (ref 70–99)
Glucose-Capillary: 205 mg/dL — ABNORMAL HIGH (ref 70–99)

## 2022-01-03 MED ORDER — ASPIRIN 81 MG PO CHEW
81.0000 mg | CHEWABLE_TABLET | Freq: Every day | ORAL | Status: DC
  Administered 2022-01-03 – 2022-01-09 (×7): 81 mg via ORAL
  Filled 2022-01-03 (×7): qty 1

## 2022-01-03 MED ORDER — INSULIN ASPART 100 UNIT/ML IJ SOLN
0.0000 [IU] | Freq: Three times a day (TID) | INTRAMUSCULAR | Status: DC
  Administered 2022-01-03: 8 [IU] via SUBCUTANEOUS
  Administered 2022-01-04: 5 [IU] via SUBCUTANEOUS
  Administered 2022-01-04: 3 [IU] via SUBCUTANEOUS
  Administered 2022-01-04: 11 [IU] via SUBCUTANEOUS
  Administered 2022-01-05: 5 [IU] via SUBCUTANEOUS
  Administered 2022-01-05 (×2): 3 [IU] via SUBCUTANEOUS
  Administered 2022-01-06: 8 [IU] via SUBCUTANEOUS
  Administered 2022-01-06 (×2): 2 [IU] via SUBCUTANEOUS
  Administered 2022-01-07: 3 [IU] via SUBCUTANEOUS
  Administered 2022-01-07: 2 [IU] via SUBCUTANEOUS
  Administered 2022-01-07: 3 [IU] via SUBCUTANEOUS
  Administered 2022-01-08: 8 [IU] via SUBCUTANEOUS
  Administered 2022-01-08 (×2): 2 [IU] via SUBCUTANEOUS

## 2022-01-03 MED ORDER — INSULIN GLARGINE-YFGN 100 UNIT/ML ~~LOC~~ SOLN
30.0000 [IU] | Freq: Every day | SUBCUTANEOUS | Status: DC
  Filled 2022-01-03: qty 0.3

## 2022-01-03 MED ORDER — INSULIN ASPART 100 UNIT/ML IJ SOLN
2.0000 [IU] | Freq: Three times a day (TID) | INTRAMUSCULAR | Status: DC
  Administered 2022-01-03 – 2022-01-04 (×3): 2 [IU] via SUBCUTANEOUS

## 2022-01-03 MED ORDER — INSULIN GLARGINE-YFGN 100 UNIT/ML ~~LOC~~ SOLN
30.0000 [IU] | Freq: Every day | SUBCUTANEOUS | Status: DC
  Administered 2022-01-03: 30 [IU] via SUBCUTANEOUS
  Filled 2022-01-03 (×2): qty 0.3

## 2022-01-03 NOTE — Progress Notes (Signed)
°  Transition of Care [facility]) Screening Note   Patient Details  Name: George Reyes. Date of Birth: 06-19-1956   Transition of Care [facility]) CM/SW Contact:    Pollie Friar, RN Phone Number: 01/03/2022, 1:59 PM    Transition of Care Department [facility]) has reviewed patient. We will continue to monitor patient advancement through interdisciplinary progression rounds. If new patient transition needs arise, please place a TOC consult.

## 2022-01-03 NOTE — Progress Notes (Signed)
Modified Barium Swallow Progress Note  Patient Details  Name: George Reyes. MRN: [id number] Date of Birth: [birth year]  Today's Date: 01/03/2022  Modified Barium Swallow completed.  Full report located under Chart Review in the Imaging Section.  Brief recommendations include the following:  Clinical Impression   Pt presents with an oral more than pharyngeal dysphagia. He has lingual pumping, rocking the bolus orally several times before he is able to propel it posteriorly. Overall he has fairly good control of bolus to keep them cohesively together, but as he rocks liquids backward, he does lose small amounts of thin and nectar thick liquids to his pharynx. He also clears his mouth fairly well except when given regular solids, at which time at least half of the bolus remained in his oral cavity until SLP cued him to clear it. His pharyngeal phase is delayed, impacting function primarily with thin and nectar thick liquids that sit in his pyriform sinuses before initiation. Thin liquids are mostly penetrated as a result (PAS 3), but he does have a few instances of silent aspiration. He protects his airway well with nectar thick liquids despite this delay, and his swallow is more functional with purees that are well contained in his valleculae. RN also made aware of emesis that occurred at the end of MBS (yellow in color, not white like barium). Pt expelled vomit from his oral cavity and bilateral nares, triggering a lot of coughing as well. SLP provided oral suction. Recommend Dys 1 (puree) diet and nectar thick liquids as tolerated.   Swallow Evaluation Recommendations  Diet Recommendations 01/03/2022  SLP Diet Recommendations Dysphagia 1 (Puree) solids;Nectar thick liquid  Liquid Administration via Cup;Straw  Medication Administration Crushed with puree  Compensations Slow rate;Small sips/bites  Postural Changes Seated upright at 90 degrees;Remain semi-upright after after feeds/meals      Follow  Up Recommendations Acute inpatient rehab      Osie Bond., M.A. [place] Pager [phone] Office [phone]  01/03/2022,3:21 PM

## 2022-01-03 NOTE — Progress Notes (Addendum)
Inpatient Rehabilitation Admissions Coordinator   I met at bedside with patient for assessment. We discussed a possible Cir admit pending further therapy progress and discussions. He has his girlfriend at home who can provide 24/7 assist. I will follow up and begin insurance Auth with [place] [employer].  Danne Baxter, RN, MSN Rehab Admissions Coordinator [phone] 01/03/2022 12:28 PM

## 2022-01-03 NOTE — Progress Notes (Signed)
Physical Therapy Treatment Patient Details Name: George Reyes. MRN: [id number] DOB: May 19, 1956 Today's Date: 01/03/2022   History of Present Illness Marlan Steward. is a 66 y.o. male who presented to the ED 1/3 after a fall and weakness while attempting to ambulate to the bathroom. 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 PMHx of prior stroke 10 years ago (with residual left sided weakness), HLD, IDDM II, prostate cancer, and HTN    PT Comments    Pt agreeable to PT session, wants to work on gait training. Pt overall requiring min-mod assist for bed mobility, transfers from low surfaces, and short-distance gait. Pt fatigues quickly, and demonstrates L sided weakness and inattention most noticeable during gait requiring max multimodal, then verbal cues for LLE progression during swing phase. Pt incontinent of stool in hallway with no awareness, requiring PT assist for clean up. PT to continue to follow, AIR remains appropriate d/c plan.    Recommendations for follow up therapy are one component of a multi-disciplinary discharge planning process, led by the attending physician.  Recommendations may be updated based on patient status, additional functional criteria and insurance authorization.  Follow Up Recommendations  Acute inpatient rehab (3hours/day)     Assistance Recommended at Discharge Frequent or constant Supervision/Assistance  Patient can return home with the following Help with stairs or ramp for entrance;Assistance with cooking/housework;A lot of help with walking and/or transfers;A lot of help with bathing/dressing/bathroom   Equipment Recommendations  Wheelchair cushion (measurements PT);Wheelchair (measurements PT)    Recommendations for Other Services       Precautions / Restrictions Precautions Precautions: Fall Precaution Comments: fecal incontinence Restrictions Weight Bearing Restrictions: No     Mobility  Bed Mobility Overal bed  mobility: Needs Assistance Bed Mobility: Supine to Sit     Supine to sit: Mod assist;HOB elevated     General bed mobility comments: assist for trunk rise, scooting to EOB especially L hip. Cues for sequential scooting to EOB    Transfers Overall transfer level: Needs assistance Equipment used: Rolling walker (2 wheels)   Sit to Stand: Mod assist           General transfer comment: mod assist from low surface of toilet and recliner for power up, rise, steadying. STS x3, from EOB, chair in hallway, and toilet    Ambulation/Gait Ambulation/Gait assistance: Min assist Gait Distance (Feet): 25 Feet (x2, +15 from bathroom to recliner) Assistive device: Rolling walker (2 wheels) Gait Pattern/deviations: Decreased stride length;Step-to pattern;Step-through pattern;Decreased weight shift to left;Decreased dorsiflexion - left;Decreased step length - left Gait velocity: decr     General Gait Details: asssist to steady, guide RW. MAX cues for increasing L foot clearance, navigating RW on L   Stairs             Wheelchair Mobility    Modified Rankin (Stroke Patients Only) Modified Rankin (Stroke Patients Only) Pre-Morbid Rankin Score: Slight disability Modified Rankin: Moderately severe disability     Balance Overall balance assessment: Needs assistance Sitting-balance support: No upper extremity supported;Feet supported Sitting balance-Leahy Scale: Fair     Standing balance support: Bilateral upper extremity supported Standing balance-Leahy Scale: Poor Standing balance comment: Reliant on BUE support and external support                            Cognition Arousal/Alertness: Awake/alert Behavior During Therapy: WFL for tasks assessed/performed;Flat affect Overall Cognitive Status: No  family/caregiver present to determine baseline cognitive functioning                                 General Comments: flat affect, incontinent of stool  in hallway with no awareness. A&Ox4        Exercises      General Comments        Pertinent Vitals/Pain Pain Assessment: Faces Faces Pain Scale: No hurt Pain Intervention(s): Monitored during session    Home Living                          Prior Function            PT Goals (current goals can now be found in the care plan section) Acute Rehab PT Goals Patient Stated Goal: to get rehab before going home PT Goal Formulation: With patient Time For Goal Achievement: 01/16/22 Potential to Achieve Goals: Good Progress towards PT goals: Progressing toward goals    Frequency    Min 4X/week      PT Plan Current plan remains appropriate    Co-evaluation              AM-PAC PT "6 Clicks" Mobility   Outcome Measure  Help needed turning from your back to your side while in a flat bed without using bedrails?: A Lot Help needed moving from lying on your back to sitting on the side of a flat bed without using bedrails?: A Lot Help needed moving to and from a bed to a chair (including a wheelchair)?: A Lot Help needed standing up from a chair using your arms (e.g., wheelchair or bedside chair)?: A Lot Help needed to walk in hospital room?: A Little Help needed climbing 3-5 steps with a railing? : A Lot 6 Click Score: 13    End of Session Equipment Utilized During Treatment: Gait belt Activity Tolerance: Patient tolerated treatment well Patient left: with call bell/phone within reach;in chair;with chair alarm set;with nursing/sitter in room Nurse Communication: Mobility status PT Visit Diagnosis: Unsteadiness on feet (R26.81);Muscle weakness (generalized) (M62.81)     Time: 4081-4481 PT Time Calculation (min) (ACUTE ONLY): 28 min  Charges:  $Gait Training: 8-22 mins $Therapeutic Activity: 8-22 mins                     Stacie Glaze, PT DPT Acute Rehabilitation Services Pager [phone]  Office [phone]    Louis Matte 01/03/2022, 4:49 PM

## 2022-01-03 NOTE — Progress Notes (Addendum)
Subjective: The patient was seen at bedside during rounds this AM. He does not endorse any new complaints today.  He says that his speech has improved since yesterday. He had his swallow study performed earlier today, at which time he vomited some liquids.  He feels fine now and does not feel nauseous at this time.  He does state that when he eats foods, he has a feeling of "phlegm" in his throat.  This goes away as he swallows. He does not choke on his food while swallowing. No other complaints or concerns today.    Objective:  Vital signs in last 24 hours: Vitals:   01/02/22 1700 01/02/22 2009 01/02/22 2347 01/03/22 0346  BP: (!) 148/95 131/78 125/82 123/86  Pulse: 96 (!) 105 92 89  Resp:  18 17 17   Temp: 98.6 F (37 C) 98.8 F (37.1 C) 98.5 F (36.9 C) 98 F (36.7 C)  TempSrc: Oral Oral Oral Oral  SpO2: 97% 100% 98% 97%    Physical Exam Constitutional:      General: He is not in acute distress.    Appearance: Normal appearance.  HENT:     Head: Normocephalic and atraumatic.  Eyes:     Extraocular Movements:     Right eye: Normal extraocular motion.     Pupils: Pupils are equal, round, and reactive to light.  Cardiovascular:     Rate and Rhythm: Regular rate and rhythm.    Heart sounds: No murmur heard.   No friction rub. No gallop.  Pulmonary:     Effort: Pulmonary effort is normal.     Breath sounds: Normal breath sounds. No wheezing, rhonchi or rales.  Abdominal:     General: Abdomen is flat. There is no distension.     Palpations: Abdomen is soft.     Tenderness: There is no abdominal tenderness.  Skin:    General: Skin is warm and dry.  Neurological:     Mental Status: He is alert and oriented to person, place, and time.  Psychiatric:        Mood and Affect: Mood normal.        Behavior: Behavior normal.    Neurologic exam: Mental status: A&Ox3 Cranial Nerves:             II: PERRL             III, IV, VI: Extra-occular motions intact bilaterally              V: Sensation intact in all 3 divisions               IX, X: Palate rises symmetrically             XI: Shoulder shrug normal bilaterally               XII: Tongue midline    Motor: Strength 5/5 in right upper extremity, 4/5 in LUE (unchanged per patient). bulk muscle and tone are normal. Able to slide heels on the bed, Strength 3/5 in BLE, improved from yesterday's exam Sensory: Light touch intact and symmetric bilaterally  Coordination: There is some dysmetria on finger-to-nose on the left side.  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 Pt with other risk factors including current smoker, poorly controlled DM, HLD, prior stroke. CT angio did not show significant stenoses. LDL 171. TTE negative. Likely his CVA secondary to small vessel disease.  At this time, main concern prior to eventual discharge to CIR is the patient's dysphagia.  His speech is more intelligible today, however earlier today during his barium swallow he vomited once.  He is on dysphagia 1 diet and we we will work with speech pathology on this and how we can give him his medications.  We will discuss this further with speech this afternoon.  - Neurology on board, appreciate their recs - Appreciate PT/OT/ST/SLP recs - DAPT for 3 weeks, followed by plavix alone - Atorvastatin 80 mg daily - Long-term BP goal normotensive - Telemetry - Frequent neuro checks - Dysphagia 1 diet   #T2DM The patient has a history of type 2 diabetes for which he is on Lantus 20 units nightly at home. A1c of 13.7.  - Glargine 30 U nightly with SSI, AC  - Start novolog 2 units TID -  CBG monitoring  #HLD - Continue atorvastatin as above   #Tobacco use - Nicotine patch  Prior to Admission Living Arrangement: Home Anticipated Discharge Location: CIR Dispo: Anticipated discharge pending speech recommendations, medical stability, and CIR placement  Orvis Brill, MD 01/03/2022, 6:42 AM Pager: [phone]  After 5pm on weekdays and 1pm on weekends: On Call pager [phone]

## 2022-01-03 NOTE — Plan of Care (Signed)

## 2022-01-03 NOTE — Progress Notes (Signed)
STROKE TEAM PROGRESS NOTE   INTERVAL HISTORY No family at the bedside. Patient is sitting up in bed.  Neurological exam is unchanged.  He has no new complaints.  Therapist recommend inpatient rehab. Vitals:   01/03/22 0346 01/03/22 0756 01/03/22 1148 01/03/22 1216  BP: 123/86 (!) 145/88 131/84 (!) 151/82  Pulse: 89 89 94 95  Resp: 17 16 16 19   Temp: 98 F (36.7 C) 98.3 F (36.8 C) 98.4 F (36.9 C) 98.1 F (36.7 C)  TempSrc: Oral Oral Oral Oral  SpO2: 97% 97% 100% 98%   CBC:  Recent Labs  Lab 01/01/22 1117 01/01/22 1125  WBC 13.6*  --   HGB 14.1 14.3  HCT 43.0 42.0  MCV 86.2  --   PLT 239  --    Basic Metabolic Panel:  Recent Labs  Lab 01/01/22 1117 01/01/22 1125  NA 136 138  K 4.4 4.4  CL 99  --   CO2 25  --   GLUCOSE 387*  --   BUN 7*  --   CREATININE 0.93  --   CALCIUM 9.3  --    Lipid Panel:  Recent Labs  Lab 01/02/22 0249  CHOL 275*  TRIG 294*  HDL 39*  CHOLHDL 7.1  VLDL 59*  LDLCALC 177*   HgbA1c:  Recent Labs  Lab 01/02/22 0249  HGBA1C 13.7*   Urine Drug Screen:  Recent Labs  Lab 01/01/22 0541  LABOPIA NONE DETECTED  COCAINSCRNUR NONE DETECTED  LABBENZ NONE DETECTED  AMPHETMU NONE DETECTED  THCU NONE DETECTED  LABBARB NONE DETECTED    Alcohol Level No results for input(s): ETH in the last 168 hours.  IMAGING past 24 hours DG Swallowing Func-Speech Pathology  Result Date: 01/03/2022 Table formatting from the original result was not included. Objective Swallowing Evaluation: Type of Study: Bedside Swallow Evaluation  Patient Details Name: Kollen Armenti. MRN: [id number] Date of Birth: 1956/09/16 Today's Date: 01/03/2022 Time: SLP Start Time (ACUTE ONLY): 0844 -SLP Stop Time (ACUTE ONLY): 0906 SLP Time Calculation (min) (ACUTE ONLY): 22 min Past Medical History: Past Medical History: Diagnosis Date  Diabetes mellitus without complication ([place])   Hypertension  Past Surgical History: No past surgical history on file. HPI: Raydan Schlabach. is a  66 y.o. male who presented to [facility] ED for weakness and a fall. He states he felt weak (diffusely) this am when he went to void. He fell in the bathroom and woke up his girlfriend. No LOC. Denies hitting his head.  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  Subjective: Pt awake, alert, pleasant, participative  Recommendations for follow up therapy are one component of a multi-disciplinary discharge planning process, led by the attending physician.  Recommendations may be updated based on patient status, additional functional criteria and insurance authorization. Assessment / Plan / Recommendation Clinical Impressions 01/03/2022 Clinical Impression Pt presents with an oral more than pharyngeal dysphagia. He has lingual pumping, rocking the bolus orally several times before he is able to propel it posteriorly. Overall he has fairly good control of bolus to keep them cohesively together, but as he rocks liquids backward, he does lose small amounts of thin and nectar thick liquids to his pharynx. He also clears his mouth fairly well except when given regular solids, at which time at least half of the  bolus remained in his oral cavity until SLP cued him to clear it. His pharyngeal phase is delayed, impacting function primarily with thin and nectar thick liquids that sit in his pyriform sinuses before initiation. Thin liquids are mostly penetrated as a result (PAS 3), but he does have a few instances of silent aspiration. He protects his airway well with nectar thick liquids despite this delay, and his swallow is more functional with purees that are well contained in his valleculae. RN also made aware of emesis that occurred at the end of MBS (yellow in color, not white like barium). Pt expelled vomit from his oral cavity and bilateral nares, triggering a lot of coughing  as well. SLP provided oral suction. Recommend Dys 1 (puree) diet and nectar thick liquids as tolerated. SLP Visit Diagnosis Dysphagia, oropharyngeal phase (R13.12) Attention and concentration deficit following -- Frontal lobe and executive function deficit following -- Impact on safety and function Mild aspiration risk;Moderate aspiration risk   Treatment Recommendations 01/03/2022 Treatment Recommendations Therapy as outlined in treatment plan below   Prognosis 01/03/2022 Prognosis for Safe Diet Advancement Good Barriers to Reach Goals -- Barriers/Prognosis Comment -- Diet Recommendations 01/03/2022 SLP Diet Recommendations Dysphagia 1 (Puree) solids;Nectar thick liquid Liquid Administration via Cup;Straw Medication Administration Crushed with puree Compensations Slow rate;Small sips/bites Postural Changes Seated upright at 90 degrees;Remain semi-upright after after feeds/meals (Comment)   Other Recommendations 01/03/2022 Recommended Consults -- Oral Care Recommendations Oral care BID Other Recommendations -- Follow Up Recommendations Acute inpatient rehab (3hours/day) Assistance recommended at discharge Intermittent Supervision/Assistance Functional Status Assessment Patient has had a recent decline in their functional status and demonstrates the ability to make significant improvements in function in a reasonable and predictable amount of time. Frequency and Duration  01/03/2022 Speech Therapy Frequency (ACUTE ONLY) min 2x/week Treatment Duration 2 weeks   Oral Phase 01/03/2022 Oral Phase Impaired Oral - Pudding Teaspoon -- Oral - Pudding Cup -- Oral - Honey Teaspoon -- Oral - Honey Cup -- Oral - Nectar Teaspoon -- Oral - Nectar Cup Lingual pumping;Reduced posterior propulsion;Delayed oral transit;Premature spillage Oral - Nectar Straw Lingual pumping;Reduced posterior propulsion;Delayed oral transit;Premature spillage Oral - Thin Teaspoon -- Oral - Thin Cup Lingual pumping;Reduced posterior propulsion;Delayed oral  transit;Premature spillage Oral - Thin Straw Lingual pumping;Reduced posterior propulsion;Delayed oral transit;Premature spillage Oral - Puree Lingual pumping;Reduced posterior propulsion;Delayed oral transit Oral - Mech Soft -- Oral - Regular Lingual pumping;Reduced posterior propulsion;Delayed oral transit;Impaired mastication;Lingual/palatal residue Oral - Multi-Consistency -- Oral - Pill -- Oral Phase - Comment --  Pharyngeal Phase 01/03/2022 Pharyngeal Phase Impaired Pharyngeal- Pudding Teaspoon -- Pharyngeal -- Pharyngeal- Pudding Cup -- Pharyngeal -- Pharyngeal- Honey Teaspoon -- Pharyngeal -- Pharyngeal- Honey Cup -- Pharyngeal -- Pharyngeal- Nectar Teaspoon -- Pharyngeal -- Pharyngeal- Nectar Cup Delayed swallow initiation-pyriform sinuses Pharyngeal -- Pharyngeal- Nectar Straw Delayed swallow initiation-pyriform sinuses Pharyngeal -- Pharyngeal- Thin Teaspoon -- Pharyngeal -- Pharyngeal- Thin Cup Delayed swallow initiation-pyriform sinuses;Penetration/Aspiration during swallow Pharyngeal Material enters airway, passes BELOW cords without attempt by patient to eject out (silent aspiration) Pharyngeal- Thin Straw Delayed swallow initiation-pyriform sinuses;Penetration/Aspiration during swallow;Penetration/Aspiration before swallow Pharyngeal Material enters airway, passes BELOW cords without attempt by patient to eject out (silent aspiration) Pharyngeal- Puree WFL Pharyngeal -- Pharyngeal- Mechanical Soft -- Pharyngeal -- Pharyngeal- Regular WFL Pharyngeal -- Pharyngeal- Multi-consistency -- Pharyngeal -- Pharyngeal- Pill -- Pharyngeal -- Pharyngeal Comment --  Cervical Esophageal Phase  01/03/2022 Cervical Esophageal Phase WFL Pudding Teaspoon -- Pudding Cup -- Honey Teaspoon -- Honey Cup -- Nectar Teaspoon --  Nectar Cup -- Nectar Straw -- Thin Teaspoon -- Thin Cup -- Thin Straw -- Puree -- Mechanical Soft -- Regular -- Multi-consistency -- Pill -- Cervical Esophageal Comment -- Osie Bond., M.A. CCC-SLP Acute  Rehabilitation Services Pager [phone] Office [phone] 01/03/2022, 9:38 AM                      PHYSICAL EXAM  Temp:  [98 F (36.7 C)-98.8 F (37.1 C)] 98.1 F (36.7 C) (01/05 1216) Pulse Rate:  [89-105] 95 (01/05 1216) Resp:  [16-19] 19 (01/05 1216) BP: (123-151)/(78-96) 151/82 (01/05 1216) SpO2:  [97 %-100 %] 98 % (01/05 1216)   Physical Exam  Constitutional: Appears well-developed and well-nourished middle-age Caucasian male.  Psych: Affect appropriate to situation Eyes: No scleral injection HENT: No OP obstrucion MSK: no joint deformities.  Cardiovascular: Normal rate and regular rhythm.  Respiratory: Effort normal, non-labored breathing GI: Soft.  No distension. There is no tenderness.  Skin: WDI  Neuro: Mental Status: Patient is awake, alert, oriented to person, place, month, year, and situation. Patient is able to give a clear and coherent history. Slight dysarthria Cranial Nerves: II: Visual Fields are full. Pupils are equal, round, and reactive to light.   III,IV, VI: EOMI without ptosis or diploplia.  Saccadic dysmetria on lateral gaze left greater than right.  No nystagmus. V: Facial sensation is symmetric to temperature VII: Facial movement is symmetric resting and smiling VIII: Hearing is intact to voice X: Palate elevates symmetrically XI: Shoulder shrug is symmetric. XII: Tongue protrudes midline without atrophy or fasciculations.  Motor: LUE drift.  Mild weakness left grip and intrinsic hand muscles.  Orbits right over left upper extremity. LLE mild drift, plantar and dorsiflexion 4/5 RUE 5/5 throughout no drift RLE 5/5 no drift Sensory: Sensation is symmetric to light touch and temperature in the arms and legs. No extinction to DSS present.  Coordination: FNF and HKS are intact bilaterally  ASSESSMENT/PLAN Mr. Salley Scarlet. is a 66 y.o. male with history of prior stroke 10 years ago (with residual left sided weakness), HLD, IDDM II,  prostate cancer, and HTN. Patient presents to [facility] ED for weakness and a fall. He states he felt weak this morning when he went to void. He fell in the bathroom and woke up his girlfriend. No LOC. Denies hitting his head. His nephew came and got him up, then they called 911. EMS reports that CBG was over 500 and he is insulin dependent. LDL is 177 and HgbA1C is 13.7. Atorvastatin 80mg  started and sliding scale insulin ordered. Imaging shows an acute infarct in the right pons and no mass effect noted.   Right pontine infarct Stroke:  Rt pontine infarct likely secondary small vessel disease with mild left hemiparesis  code Stroke -No acute abnormality.  CTA head & neck- Moderate focal stenosis at the origin of the left vertebral artery, which is diminutive and non dominant. Calcified plaque at the bifurcation at the proximal bilateral ICAs MRI  Acute infarct in the right pons. Slight edema without mass effect. Remote pontine infarcts. Microvascular ischemic disease. Chronic microhemorrhages 2D Echo ejection fraction 55 to 60%.  No wall motion abnormalities.   LDL 177 HgbA1c 13.7 VTE prophylaxis - Lovenox aspirin 81 mg daily prior to admission, now on aspirin 81 mg daily and clopidogrel 75 mg daily for 3 weeks and then plavix alone.  Therapy recommendations:  CIR Disposition:  Pending  Hypertension Stable Permissive hypertension (OK if < 220/120) but gradually  normalize in 5-7 days Long-term BP goal normotensive  Hyperlipidemia Home meds:  none LDL 177, goal < 70 Add atorvastatin 80mg   High intensity statin not indicated  Continue statin at discharge  Diabetes type II Uncontrolled Home meds:  Insulin HgbA1c 13.7, goal < 7.0 CBGs SSI Semglee 20u QHS Novolog TID, QHS  Other Stroke Risk Factors Cigarette smoker 1ppd, advised to stop smoking Nicotine patch order Hx stroke/TIA Stroke approx. 10 years ago  Other Active Problems Dysphagia Failed swallow screen Modified barium swallow  by [facility] day # 2  Patient presented with left leg weakness secondary to right paramedian pontine infarct from small vessel disease.  Continue aspirin Plavix for 3 weeks followed by Plavix alone and aggressive risk factor modification.  Patient counseled to quit smoking completely and seems agreeable.  Continue physical and Occupational Therapy  and will likely need transfer to inpatient rehab.  Discussed with patient and answered questions.  Stroke team will sign off.  Kindly call for questions greater than 50% time during this 35-minute visit was spent in counseling and coordination of care about his lacunar stroke and discussion about stroke prevention and treatment and answering questions.  Antony Contras, MD Medical Director [facility] Stroke Center Pager: [phone] 01/03/2022 2:28 PM   To contact Stroke Continuity provider, please refer to [email or web address] After hours, contact General Neurology

## 2022-01-03 NOTE — Progress Notes (Signed)
SLP called to report pt had large amount of emesis after swallow eval. MD notified

## 2022-01-04 ENCOUNTER — Encounter (HOSPITAL_COMMUNITY): Payer: Self-pay | Admitting: Internal Medicine

## 2022-01-04 DIAGNOSIS — I639 Cerebral infarction, unspecified: Secondary | ICD-10-CM | POA: Diagnosis not present

## 2022-01-04 LAB — CBC
HCT: 40 % (ref 39.0–52.0)
Hemoglobin: 13.6 g/dL (ref 13.0–17.0)
MCH: 28.8 pg (ref 26.0–34.0)
MCHC: 34 g/dL (ref 30.0–36.0)
MCV: 84.7 fL (ref 80.0–100.0)
Platelets: 230 10*3/uL (ref 150–400)
RBC: 4.72 MIL/uL (ref 4.22–5.81)
RDW: 12.5 % (ref 11.5–15.5)
WBC: 8.5 10*3/uL (ref 4.0–10.5)
nRBC: 0 % (ref 0.0–0.2)

## 2022-01-04 LAB — GLUCOSE, CAPILLARY
Glucose-Capillary: 159 mg/dL — ABNORMAL HIGH (ref 70–99)
Glucose-Capillary: 310 mg/dL — ABNORMAL HIGH (ref 70–99)
Glucose-Capillary: 232 mg/dL — ABNORMAL HIGH (ref 70–99)
Glucose-Capillary: 148 mg/dL — ABNORMAL HIGH (ref 70–99)

## 2022-01-04 LAB — BASIC METABOLIC PANEL
Anion gap: 9 (ref 5–15)
BUN: 19 mg/dL (ref 8–23)
CO2: 26 mmol/L (ref 22–32)
Calcium: 8.6 mg/dL — ABNORMAL LOW (ref 8.9–10.3)
Chloride: 101 mmol/L (ref 98–111)
Creatinine, Ser: 0.83 mg/dL (ref 0.61–1.24)
GFR, Estimated: >60 mL/min (ref >60)
Glucose, Bld: 145 mg/dL — ABNORMAL HIGH (ref 70–99)
Potassium: 3.1 mmol/L — ABNORMAL LOW (ref 3.5–5.1)
Sodium: 136 mmol/L (ref 135–145)

## 2022-01-04 MED ORDER — POTASSIUM CHLORIDE 20 MEQ PO PACK
40.0000 meq | PACK | Freq: Two times a day (BID) | ORAL | Status: AC
  Administered 2022-01-04 (×2): 40 meq via ORAL
  Filled 2022-01-04 (×2): qty 2

## 2022-01-04 MED ORDER — INSULIN GLARGINE-YFGN 100 UNIT/ML ~~LOC~~ SOLN
33.0000 [IU] | Freq: Every day | SUBCUTANEOUS | Status: DC
  Administered 2022-01-04 – 2022-01-08 (×5): 33 [IU] via SUBCUTANEOUS
  Filled 2022-01-04 (×6): qty 0.33

## 2022-01-04 MED ORDER — INSULIN ASPART 100 UNIT/ML IJ SOLN
6.0000 [IU] | Freq: Three times a day (TID) | INTRAMUSCULAR | Status: DC
  Administered 2022-01-04 – 2022-01-06 (×6): 6 [IU] via SUBCUTANEOUS

## 2022-01-04 NOTE — PMR Pre-admission (Shared)
PMR Admission Coordinator Pre-Admission Assessment  Patient: George Reyes. is an 66 y.o., male MRN: [id number] DOB: November 17, 1956 Height:   Weight:    Insurance Information HMO:     PPO:      PCP:      IPA:      80/20:      OTHER:  PRIMARY: [place] /[place]      Policy#: [id number]      Subscriber: pt CM Name: ***      Phone#: ***     Fax#: [phone] Pre-Cert#: [id number]      Employer:  Benefits:  Phone #: [phone]     Name: 1/6 Eff. Date: 02/28/2019 active     Deduct: none      Out of Pocket Max: none      Life Max: none CIR: 100%      SNF: 100% Outpatient: 100%     Co-Pay:  Home Health: 100%      Co-Pay:  DME: 100%     Co-Pay:  Providers: in network  SECONDARY: none  Financial Counselor:       Phone#:   The [facility] for patients in Inpatient Rehabilitation Facilities with attached Privacy Act [place] Records was provided and verbally reviewed with: N/A  Emergency Contact Information Contact Information     Name Relation Home Work Mobile   [place] Significant other   [phone]   logan,beatrice Mother   [phone]   logan,james Denman George   [phone]       Current Medical History  Patient Admitting Diagnosis: CVA  History of Present Illness: 66 year old male with history of prostate cancer, type 2 DM on insulin, HLD and 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 BP 165/119 in ER but denied personal history of HTN. MRI showed an acute infarct in the right pons as well as additional remote pontine infarcts. Neurology consulted. CT angio without significant stenosis. Hgb A1c 13.7.  CVA felt likely secondary to Small vessel disease. 2 D echo with EF 55 to 60%. LDL 177. VTE prophylaxis lovenox. To take ASA and clopidogrel daily for 3 weeks and then plavix alone.   Noted dysarthric with a slight facial droop. MBS showed few instants of  silent aspiration . Dysphagia 1 diet recommended. Counseled to quit smoking. Lantus at home and CBGS monitored. To begin Atorvastatin.  Complete NIHSS TOTAL: 1  Patient's medical record from [facility] has been reviewed by the rehabilitation admission coordinator and physician.  Past Medical History  Past Medical History:  Diagnosis Date   Diabetes mellitus without complication ([place])    Hypertension     Has the patient had major surgery during 100 days prior to admission? No  Family History   family history includes Cancer in his father; Heart attack in his father and mother.  Current Medications  Current Facility-Administered Medications:    acetaminophen (TYLENOL) tablet 650 mg, 650 mg, Oral, Q4H PRN **OR** acetaminophen (TYLENOL) 160 MG/5ML solution 650 mg, 650 mg, Per Tube, Q4H PRN **OR** acetaminophen (TYLENOL) suppository 650 mg, 650 mg, Rectal, Q4H PRN, Madalyn Rob, MD   aspirin chewable tablet 81 mg, 81 mg, Oral, Daily, Mignon Pine, RPH, 81 mg at 01/04/22 0920   atorvastatin (LIPITOR) tablet 80 mg, 80 mg, Oral, Daily, Orvis Brill, MD, 80 mg at 01/04/22 0920   clopidogrel (PLAVIX) tablet 75 mg, 75 mg, Oral, Daily,  Gardiner Barefoot, NP, 75 mg at 01/04/22 0920   enoxaparin (LOVENOX) injection 40 mg, 40 mg, Subcutaneous, Q24H, Madalyn Rob, MD, 40 mg at 01/03/22 1649   food thickener (SIMPLYTHICK (HONEY/LEVEL 3/MODERATELY THICK)) 10 packet, 10 packet, Oral, PRN, Velna Ochs, MD   insulin aspart (novoLOG) injection 0-15 Units, 0-15 Units, Subcutaneous, TID WC, Madalyn Rob, MD, 11 Units at 01/04/22 1226   insulin aspart (novoLOG) injection 6 Units, 6 Units, Subcutaneous, TID WC, Aslam, Sadia, MD   insulin glargine-yfgn (SEMGLEE) injection 33 Units, 33 Units, Subcutaneous, QHS, Aslam, Sadia, MD   living well with diabetes book MISC, , Does not apply, Once, Velna Ochs, MD   nicotine (NICODERM CQ - dosed in mg/24 hours) patch 21 mg, 21 mg,  Transdermal, Daily, Madalyn Rob, MD, 21 mg at 01/04/22 0924   potassium chloride (KLOR-CON) packet 40 mEq, 40 mEq, Oral, BID, Orvis Brill, MD, 40 mEq at 01/04/22 0920   senna-docusate (Senokot-S) tablet 1 tablet, 1 tablet, Oral, QHS PRN, Madalyn Rob, MD  Patients Current Diet:  Diet Order             DIET - DYS 1 Room service appropriate? Yes with Assist; Fluid consistency: Nectar Thick  Diet effective now                   Precautions / Restrictions Precautions Precautions: Fall Precaution Comments: fecal incontinence Restrictions Weight Bearing Restrictions: No   Has the patient had 2 or more falls or a fall with injury in the past year? No  Prior Activity Level Community (5-7x/wk): Mod I with cane  Prior Functional Level Self Care: Did the patient need help bathing, dressing, using the toilet or eating? Needed some help  Indoor Mobility: Did the patient need assistance with walking from room to room (with or without device)? Independent  Stairs: Did the patient need assistance with internal or external stairs (with or without device)? Independent  Functional Cognition: Did the patient need help planning regular tasks such as shopping or remembering to take medications? Needed some help  Patient Information Are you of Hispanic, Latino/a,or Spanish origin?: A. No, not of Hispanic, Latino/a, or Spanish origin What is your race?: B. Black or African American Do you need or want an interpreter to communicate with a doctor or health care staff?: 0. No  Patient's Response To:  Health Literacy and Transportation Is the patient able to respond to health literacy and transportation needs?: Yes Health Literacy - How often do you need to have someone help you when you read instructions, pamphlets, or other written material from your doctor or pharmacy?: Never In the past 12 months, has lack of transportation kept you from medical appointments or from getting  medications?: No In the past 12 months, has lack of transportation kept you from meetings, work, or from getting things needed for daily living?: No  Home Assistive Devices / [place]: Cane - single point, Toilet riser  Prior Device Use: Indicate devices/aids used by the patient prior to current illness, exacerbation or injury?  cane  Current Functional Level Cognition  Overall Cognitive Status: Within Functional Limits for tasks assessed Orientation Level: Oriented X4 General Comments: knew he was having difficulty controlling stool/urine, appropriately frustrated he is less than able to take care of himself    Extremity Assessment (includes Sensation/Coordination)  Upper Extremity Assessment: LUE deficits/detail LUE Deficits / Details: impaired AROM, and globally 4/5 MMT throughout LUE. deminished sensation reported in distal LUE, ulnar aspect>radial. Able  to use funcitonally with increased time and effort LUE Sensation: decreased light touch LUE Coordination: decreased fine motor, decreased gross motor  Lower Extremity Assessment: Defer to PT evaluation LLE Deficits / Details: Grossly 2+/5 throughout. Reporting numbness on outer part of L thigh    ADLs  Overall ADL's : Needs assistance/impaired Eating/Feeding: Set up, Sitting Grooming: Supervision/safety, Set up, Sitting Upper Body Bathing: Supervision/ safety, Set up, Sitting Lower Body Bathing: Maximal assistance, Sit to/from stand Upper Body Dressing : Supervision/safety, Set up, Sitting Lower Body Dressing: Maximal assistance, Sit to/from stand Toilet Transfer: Maximal assistance, +2 for physical assistance, +2 for safety/equipment, Ambulation, Stand-pivot Toileting- Clothing Manipulation and Hygiene: Maximal assistance, +2 for safety/equipment, +2 for physical assistance, Sit to/from stand Functional mobility during ADLs: Maximal assistance, +2 for physical assistance, +2 for safety/equipment General ADL  Comments: pt with difficulty coordinating LLE when standing, required max A +2 for all OOB tasks. benefits from cues to initiate ans sequence through tasks    Mobility  Overal bed mobility: Needs Assistance Bed Mobility: Supine to Sit Supine to sit: HOB elevated, Min assist Sit to supine: Mod assist, +2 for physical assistance General bed mobility comments: assist to lift trunk, cues for technique with rail    Transfers  Overall transfer level: Needs assistance Equipment used: Rolling walker (2 wheels) Transfers: Sit to/from Stand Sit to Stand: Min assist, From elevated surface General transfer comment: assist up from EOB and bed height raised due to pt's height    Ambulation / Gait / Stairs / Wheelchair Mobility  Ambulation/Gait Ambulation/Gait assistance: Herbalist (Feet): 120 Feet Assistive device: Rolling walker (2 wheels) Gait Pattern/deviations: Trunk flexed, Wide base of support, Shuffle, Decreased dorsiflexion - left, Decreased stride length, Step-through pattern General Gait Details: assist for balance, occasional assist for walker with turns, obstacles; cues throughout for posture, proximity to walker Gait velocity: decr    Posture / Balance Balance Overall balance assessment: Needs assistance Sitting-balance support: Feet supported Sitting balance-Leahy Scale: Fair Standing balance support: Bilateral upper extremity supported, Reliant on assistive device for balance Standing balance-Leahy Scale: Poor Standing balance comment: Reliant on BUE support and external support    Special needs/care consideration Hgb A1c 13.7   Previous Home Environment  Living Arrangements: Spouse/significant other  Lives With: Significant other Available Help at Discharge: Family, Available 24 hours/day Type of Home: House Home Layout: One level Home Access: Stairs to enter Entrance Stairs-Rails: Right Entrance Stairs-Number of Steps: 4 Bathroom Shower/Tub: Geneticist, molecular, Multimedia programmer: Standard Bathroom Accessibility: Yes How Accessible: Accessible via walker [place]: No  Discharge Living Setting Plans for Discharge Living Setting: Patient's home, Lives with (comment) (s/o) Type of Home at Discharge: House Discharge Home Layout: One level Discharge Home Access: Stairs to enter Entrance Stairs-Rails: Right Entrance Stairs-Number of Steps: 4 Discharge Bathroom Shower/Tub: Tub/shower unit Discharge Bathroom Toilet: Standard Discharge Bathroom Accessibility: Yes How Accessible: Accessible via walker Does the patient have any problems obtaining your medications?: No  Social/Family/Support Systems Patient Roles: Partner Contact Information: Katharine Look, girlfriend Anticipated Caregiver: Katharine Look Anticipated Ambulance person Information: see contacts Ability/Limitations of Caregiver: none Caregiver Availability: 24/7 Discharge Plan Discussed with Primary Caregiver: Yes Is Caregiver In Agreement with Plan?: Yes Does Caregiver/Family have Issues with Lodging/Transportation while Pt is in Rehab?: No  Goals Patient/Family Goal for Rehab: Mod I to supervision with PT, OT and SLP Expected length of stay: ELOS 7 to 10 days Pt/Family Agrees to Admission and willing to participate: Yes Program Orientation Provided &  Reviewed with Pt/Caregiver Including Roles  & Responsibilities: Yes  Decrease burden of Care through IP rehab admission: n/a  Possible need for SNF placement upon discharge: not anticipated  Patient Condition: I have reviewed medical records from [facility], spoken with CM, and patient. I met with patient at the bedside for inpatient rehabilitation assessment.  Patient will benefit from ongoing PT, OT, and SLP, can actively participate in 3 hours of therapy a day 5 days of the week, and can make measurable gains during the admission.  Patient will also benefit from the coordinated team approach during an  Inpatient Acute Rehabilitation admission.  The patient will receive intensive therapy as well as Rehabilitation physician, nursing, social worker, and care management interventions.  Due to bladder management, bowel management, safety, skin/wound care, disease management, medication administration, pain management, and patient education the patient requires 24 hour a day rehabilitation nursing.  The patient is currently min assist overall with mobility and basic ADLs.  Discharge setting and therapy post discharge at home with home health is anticipated.  Patient has agreed to participate in the Acute Inpatient Rehabilitation Program and will admit today.  Preadmission Screen Completed By: Danne Baxter RN MSN with updates by Cleatrice Burke, 01/04/2022 3:09 PM ______________________________________________________________________   Discussed status with Dr. Marland Kitchen on *** at *** and received approval for admission today.  Admission Coordinator: Danne Baxter RN MSN with updates by  Cleatrice Burke, RN, time Marland KitchenSudie Grumbling ***   Assessment/Plan: Diagnosis: Does the need for close, 24 hr/day Medical supervision in concert with the patient's rehab needs make it unreasonable for this patient to be served in a less intensive setting? {yes_no_potentially:3041433} Co-Morbidities requiring supervision/potential complications: *** Due to {due YB:0175102}, does the patient require 24 hr/day rehab nursing? {yes_no_potentially:3041433} Does the patient require coordinated care of a physician, rehab nurse, PT, OT, and SLP to address physical and functional deficits in the context of the above medical diagnosis(es)? {yes_no_potentially:3041433} Addressing deficits in the following areas: {deficits:3041436} Can the patient actively participate in an intensive therapy program of at least 3 hrs of therapy 5 days a week? {yes_no_potentially:3041433} The potential for patient to make measurable gains while on  inpatient rehab is {potential:3041437} Anticipated functional outcomes upon discharge from inpatient rehab: {functional outcomes:304600100} PT, {functional outcomes:304600100} OT, {functional outcomes:304600100} SLP Estimated rehab length of stay to reach the above functional goals is: *** Anticipated discharge destination: {anticipated dc setting:21604} 10. Overall Rehab/Functional Prognosis: {potential:3041437}   MD Signature: ***

## 2022-01-04 NOTE — Progress Notes (Signed)
Speech Language Pathology Treatment: Dysphagia  Patient Details Name: George Reyes. MRN: [id number] DOB: [birth year] Today's Date: 01/04/2022 Time: 1941-7408 SLP Time Calculation (min) (ACUTE ONLY): 15 min  Assessment / Plan / Recommendation Clinical Impression  Pt was seen for f/u after MBS. He reports eating all of his breakfast, and his lunch tray is mostly cleared at his bedside. Pt has no overt s/s of aspiration and functional oral clearance with purees and nectar thick liquids, but when given advanced trials of soft solids he does start to have more significant oral residue. SLP provided consistent cues for him to clear this, although he does clear it well when cued to do so. Immediate coughing was noted at times only with more solid textures. Will leave on current diet for now (Dys 1, nectar thick liquids), but will continue to f/u for advancement.   HPI HPI: George Reyes. is a 66 y.o. male who presented to [facility] ED for weakness and a fall. He states he felt weak (diffusely) this am when he went to void. He fell in the bathroom and woke up his girlfriend. No LOC. Denies hitting his head.  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      SLP Plan  Continue with current plan of care      Recommendations for follow up therapy are one component of a multi-disciplinary discharge planning process, led by the attending physician.  Recommendations may be updated based on patient status, additional functional criteria and insurance authorization.    Recommendations  Diet recommendations: Dysphagia 1 (puree);Nectar-thick liquid Liquids provided via: Cup;Straw Medication Administration: Crushed with puree Supervision: Patient able to self feed;Intermittent supervision to cue for compensatory strategies Compensations: Slow rate;Small  sips/bites Postural Changes and/or Swallow Maneuvers: Seated upright 90 degrees                Oral Care Recommendations: Oral care BID Follow Up Recommendations: Acute inpatient rehab (3hours/day) Assistance recommended at discharge: Intermittent Supervision/Assistance SLP Visit Diagnosis: Dysphagia, oropharyngeal phase (R13.12) Plan: Continue with current plan of care           Osie Bond., M.A. [facility] Pager [phone] Office [phone]  01/04/2022, 4:41 PM

## 2022-01-04 NOTE — H&P (Incomplete)
Physical Medicine and Rehabilitation Admission H&P    CC: Functional deficits due to acute infarct in the right pons  HPI: 66 year old male who has history of previous stroke approximately 10 years ago with residual eft leg weakness. He presented to the [facility] ED for weakness and a fall while in his bathroom. He suffered no injury from the fall and did not lose consciousness, but he felt his left lower extremity weakness was worse and his speech was affected. Capillary blood glucose was >500. Imaging was significant for acute infarct in the right pons. 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 His 2D echo revealed no wall abnormalities and EF estimated at 55-60%.  ROS  Past Medical History:  Diagnosis Date   Diabetes mellitus without complication ([place])    Hypertension    History reviewed. No pertinent surgical history. Family History  Problem Relation Age of Onset   Heart attack Mother    Heart attack Father    Cancer Father    Social History: He has been a life long smoker having smoked approximately one ppd since teen years and states he quit 3 weeks ago. Denies any alcohol intake or illicit drug use. 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 Lives with wife  Allergies: No Known Allergies  Medications Prior to Admission  Medication Sig Dispense Refill   Ascorbic Acid (VITAMIN C PO) Take 1 tablet by mouth daily.     aspirin EC 81 MG tablet Take 81 mg by mouth every evening. Swallow whole.     B Complex Vitamins (VITAMIN B-COMPLEX PO) Take 1 tablet by mouth daily.     ibuprofen (ADVIL) 200 MG tablet Take 200 mg by mouth every 6 (six) hours as needed for fever, headache or mild pain.     LANTUS SOLOSTAR 100 UNIT/ML Solostar Pen Inject 10 Units into the skin every evening.     VITAMIN A PO Take 1 tablet by mouth daily.     VITAMIN D PO Take 1 capsule by mouth daily.     XTANDI 40 MG capsule Take 120 mg by mouth  every evening.      Drug Regimen Review  Drug regimen was reviewed and remains appropriate with no significant issues identified  Home: Home Living Family/patient expects to be discharged to:: Private residence Living Arrangements: Spouse/significant other Available Help at Discharge: Family, Available 24 hours/day Type of Home: House Home Access: Stairs to enter Technical brewer of Steps: 4 Entrance Stairs-Rails: Right Home Layout: One level Bathroom Shower/Tub: Tub/shower unit, Multimedia programmer: Standard Home Equipment: Cane - single point, Toilet riser   Functional History: Prior Function Prior Level of Function : Needs assist Mobility Comments: Uses cane for ambulation ADLs Comments: Girlfriend supervises for bathing  Functional Status:  Mobility: Bed Mobility Overal bed mobility: Needs Assistance Bed Mobility: Supine to Sit Supine to sit: Mod assist, HOB elevated Sit to supine: Mod assist, +2 for physical assistance General bed mobility comments: assist for trunk rise, scooting to EOB especially L hip. Cues for sequential scooting to EOB Transfers Overall transfer level: Needs assistance Equipment used: Rolling walker (2 wheels) Transfers: Sit to/from Stand Sit to Stand: Mod assist General transfer comment: mod assist from low surface of toilet and recliner for power up, rise, steadying. STS x3, from EOB, chair in hallway, and toilet Ambulation/Gait Ambulation/Gait assistance: Min assist Gait Distance (Feet): 25 Feet (x2, +15 from bathroom to recliner) Assistive device: Rolling walker (  2 wheels) Gait Pattern/deviations: Decreased stride length, Step-to pattern, Step-through pattern, Decreased weight shift to left, Decreased dorsiflexion - left, Decreased step length - left General Gait Details: asssist to steady, guide RW. MAX cues for increasing L foot clearance, navigating RW on L Gait velocity: decr    ADL: ADL Overall ADL's : Needs  assistance/impaired Eating/Feeding: Set up, Sitting Grooming: Supervision/safety, Set up, Sitting Upper Body Bathing: Supervision/ safety, Set up, Sitting Lower Body Bathing: Maximal assistance, Sit to/from stand Upper Body Dressing : Supervision/safety, Set up, Sitting Lower Body Dressing: Maximal assistance, Sit to/from stand Toilet Transfer: Maximal assistance, +2 for physical assistance, +2 for safety/equipment, Ambulation, Stand-pivot Toileting- Clothing Manipulation and Hygiene: Maximal assistance, +2 for safety/equipment, +2 for physical assistance, Sit to/from stand Functional mobility during ADLs: Maximal assistance, +2 for physical assistance, +2 for safety/equipment General ADL Comments: pt with difficulty coordinating LLE when standing, required max A +2 for all OOB tasks. benefits from cues to initiate ans sequence through tasks  Cognition: Cognition Overall Cognitive Status: No family/caregiver present to determine baseline cognitive functioning Orientation Level: Oriented X4 Cognition Arousal/Alertness: Awake/alert Behavior During Therapy: WFL for tasks assessed/performed, Flat affect Overall Cognitive Status: No family/caregiver present to determine baseline cognitive functioning General Comments: flat affect, incontinent of stool in hallway with no awareness. A&Ox4  Physical Exam: Blood pressure 120/68, pulse (!) 107, temperature 98.6 F (37 C), temperature source Oral, resp. rate 18, SpO2 99 %. Physical Exam  Results for orders placed or performed during the hospital encounter of 01/01/22 (from the past 48 hour(s))  Glucose, capillary     Status: Abnormal   Collection Time: 01/02/22  6:12 PM  Result Value Ref Range   Glucose-Capillary 192 (H) 70 - 99 mg/dL    Comment: Glucose reference range applies only to samples taken after fasting for at least 8 hours.  Glucose, capillary     Status: Abnormal   Collection Time: 01/02/22  9:15 PM  Result Value Ref Range    Glucose-Capillary 286 (H) 70 - 99 mg/dL    Comment: Glucose reference range applies only to samples taken after fasting for at least 8 hours.  Glucose, capillary     Status: Abnormal   Collection Time: 01/03/22  6:27 AM  Result Value Ref Range   Glucose-Capillary 224 (H) 70 - 99 mg/dL    Comment: Glucose reference range applies only to samples taken after fasting for at least 8 hours.  Glucose, capillary     Status: Abnormal   Collection Time: 01/03/22 11:49 AM  Result Value Ref Range   Glucose-Capillary 321 (H) 70 - 99 mg/dL    Comment: Glucose reference range applies only to samples taken after fasting for at least 8 hours.  Glucose, capillary     Status: Abnormal   Collection Time: 01/03/22  4:57 PM  Result Value Ref Range   Glucose-Capillary 258 (H) 70 - 99 mg/dL    Comment: Glucose reference range applies only to samples taken after fasting for at least 8 hours.  Glucose, capillary     Status: Abnormal   Collection Time: 01/03/22  9:28 PM  Result Value Ref Range   Glucose-Capillary 205 (H) 70 - 99 mg/dL    Comment: Glucose reference range applies only to samples taken after fasting for at least 8 hours.  CBC     Status: None   Collection Time: 01/04/22 12:13 AM  Result Value Ref Range   WBC 8.5 4.0 - 10.5 K/uL   RBC 4.72 4.22 -  5.81 MIL/uL   Hemoglobin 13.6 13.0 - 17.0 g/dL   HCT 40.0 39.0 - 52.0 %   MCV 84.7 80.0 - 100.0 fL   MCH 28.8 26.0 - 34.0 pg   MCHC 34.0 30.0 - 36.0 g/dL   RDW 12.5 11.5 - 15.5 %   Platelets 230 150 - 400 K/uL   nRBC 0.0 0.0 - 0.2 %    Comment: Performed at [facility], [place] [address], [place], [place] [address]  Basic metabolic panel     Status: Abnormal   Collection Time: 01/04/22 12:13 AM  Result Value Ref Range   Sodium 136 135 - 145 mmol/L   Potassium 3.1 (L) 3.5 - 5.1 mmol/L   Chloride 101 98 - 111 mmol/L   CO2 26 22 - 32 mmol/L   Glucose, Bld 145 (H) 70 - 99 mg/dL    Comment: Glucose reference range applies only to samples taken  after fasting for at least 8 hours.   BUN 19 8 - 23 mg/dL   Creatinine, Ser 0.83 0.61 - 1.24 mg/dL   Calcium 8.6 (L) 8.9 - 10.3 mg/dL   GFR, Estimated >60 >60 mL/min    Comment: (NOTE) Calculated using the CKD-EPI Creatinine Equation (2021)    Anion gap 9 5 - 15    Comment: Performed at [facility] [address] [place], [place] [address]  Glucose, capillary     Status: Abnormal   Collection Time: 01/04/22  6:12 AM  Result Value Ref Range   Glucose-Capillary 159 (H) 70 - 99 mg/dL    Comment: Glucose reference range applies only to samples taken after fasting for at least 8 hours.  Glucose, capillary     Status: Abnormal   Collection Time: 01/04/22 11:22 AM  Result Value Ref Range   Glucose-Capillary 310 (H) 70 - 99 mg/dL    Comment: Glucose reference range applies only to samples taken after fasting for at least 8 hours.   Comment 1 Notify RN    Comment 2 Document in Chart    DG Swallowing Func-Speech Pathology  Result Date: 01/03/2022 Table formatting from the original result was not included. Objective Swallowing Evaluation: Type of Study: Bedside Swallow Evaluation  Patient Details Name: Jayvon Mounger. MRN: [id number] Date of Birth: 02-03-1956 Today's Date: 01/03/2022 Time: SLP Start Time (ACUTE ONLY): 0844 -SLP Stop Time (ACUTE ONLY): 0906 SLP Time Calculation (min) (ACUTE ONLY): 22 min Past Medical History: Past Medical History: Diagnosis Date  Diabetes mellitus without complication ([place])   Hypertension  Past Surgical History: No past surgical history on file. HPI: Burke Terry. is a 66 y.o. male who presented to [facility] ED for weakness and a fall. He states he felt weak (diffusely) this am when he went to void. He fell in the bathroom and woke up his girlfriend. No LOC. Denies hitting his head.  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 Slight edema without mass effect."  Pt with a PMHx of prior stroke 10 years ago (with residual left sided weakness), HLD, IDDM II, prostate  cancer, and HTN.  Subjective: Pt awake, alert, pleasant, participative  Recommendations for follow up therapy are one component of a multi-disciplinary discharge planning process, led by the attending physician.  Recommendations may be updated based on patient status, additional functional criteria and insurance authorization. Assessment / Plan / Recommendation Clinical Impressions 01/03/2022 Clinical Impression Pt presents with an oral more than pharyngeal dysphagia. He has lingual pumping, rocking the  bolus orally several times before he is able to propel it posteriorly. Overall he has fairly good control of bolus to keep them cohesively together, but as he rocks liquids backward, he does lose small amounts of thin and nectar thick liquids to his pharynx. He also clears his mouth fairly well except when given regular solids, at which time at least half of the bolus remained in his oral cavity until SLP cued him to clear it. His pharyngeal phase is delayed, impacting function primarily with thin and nectar thick liquids that sit in his pyriform sinuses before initiation. Thin liquids are mostly penetrated as a result (PAS 3), but he does have a few instances of silent aspiration. He protects his airway well with nectar thick liquids despite this delay, and his swallow is more functional with purees that are well contained in his valleculae. RN also made aware of emesis that occurred at the end of MBS (yellow in color, not white like barium). Pt expelled vomit from his oral cavity and bilateral nares, triggering a lot of coughing as well. SLP provided oral suction. Recommend Dys 1 (puree) diet and nectar thick liquids as tolerated. SLP Visit Diagnosis Dysphagia, oropharyngeal phase (R13.12) Attention and concentration deficit following -- Frontal lobe and executive function deficit following -- Impact on safety and function Mild aspiration risk;Moderate aspiration risk   Treatment Recommendations 01/03/2022 Treatment  Recommendations Therapy as outlined in treatment plan below   Prognosis 01/03/2022 Prognosis for Safe Diet Advancement Good Barriers to Reach Goals -- Barriers/Prognosis Comment -- Diet Recommendations 01/03/2022 SLP Diet Recommendations Dysphagia 1 (Puree) solids;Nectar thick liquid Liquid Administration via Cup;Straw Medication Administration Crushed with puree Compensations Slow rate;Small sips/bites Postural Changes Seated upright at 90 degrees;Remain semi-upright after after feeds/meals (Comment)   Other Recommendations 01/03/2022 Recommended Consults -- Oral Care Recommendations Oral care BID Other Recommendations -- Follow Up Recommendations Acute inpatient rehab (3hours/day) Assistance recommended at discharge Intermittent Supervision/Assistance Functional Status Assessment Patient has had a recent decline in their functional status and demonstrates the ability to make significant improvements in function in a reasonable and predictable amount of time. Frequency and Duration  01/03/2022 Speech Therapy Frequency (ACUTE ONLY) min 2x/week Treatment Duration 2 weeks   Oral Phase 01/03/2022 Oral Phase Impaired Oral - Pudding Teaspoon -- Oral - Pudding Cup -- Oral - Honey Teaspoon -- Oral - Honey Cup -- Oral - Nectar Teaspoon -- Oral - Nectar Cup Lingual pumping;Reduced posterior propulsion;Delayed oral transit;Premature spillage Oral - Nectar Straw Lingual pumping;Reduced posterior propulsion;Delayed oral transit;Premature spillage Oral - Thin Teaspoon -- Oral - Thin Cup Lingual pumping;Reduced posterior propulsion;Delayed oral transit;Premature spillage Oral - Thin Straw Lingual pumping;Reduced posterior propulsion;Delayed oral transit;Premature spillage Oral - Puree Lingual pumping;Reduced posterior propulsion;Delayed oral transit Oral - Mech Soft -- Oral - Regular Lingual pumping;Reduced posterior propulsion;Delayed oral transit;Impaired mastication;Lingual/palatal residue Oral - Multi-Consistency -- Oral - Pill -- Oral  Phase - Comment --  Pharyngeal Phase 01/03/2022 Pharyngeal Phase Impaired Pharyngeal- Pudding Teaspoon -- Pharyngeal -- Pharyngeal- Pudding Cup -- Pharyngeal -- Pharyngeal- Honey Teaspoon -- Pharyngeal -- Pharyngeal- Honey Cup -- Pharyngeal -- Pharyngeal- Nectar Teaspoon -- Pharyngeal -- Pharyngeal- Nectar Cup Delayed swallow initiation-pyriform sinuses Pharyngeal -- Pharyngeal- Nectar Straw Delayed swallow initiation-pyriform sinuses Pharyngeal -- Pharyngeal- Thin Teaspoon -- Pharyngeal -- Pharyngeal- Thin Cup Delayed swallow initiation-pyriform sinuses;Penetration/Aspiration during swallow Pharyngeal Material enters airway, passes BELOW cords without attempt by patient to eject out (silent aspiration) Pharyngeal- Thin Straw Delayed swallow initiation-pyriform sinuses;Penetration/Aspiration during swallow;Penetration/Aspiration before swallow Pharyngeal Material enters airway, passes  BELOW cords without attempt by patient to eject out (silent aspiration) Pharyngeal- Puree WFL Pharyngeal -- Pharyngeal- Mechanical Soft -- Pharyngeal -- Pharyngeal- Regular WFL Pharyngeal -- Pharyngeal- Multi-consistency -- Pharyngeal -- Pharyngeal- Pill -- Pharyngeal -- Pharyngeal Comment --  Cervical Esophageal Phase  01/03/2022 Cervical Esophageal Phase WFL Pudding Teaspoon -- Pudding Cup -- Honey Teaspoon -- Honey Cup -- Nectar Teaspoon -- Nectar Cup -- Nectar Straw -- Thin Teaspoon -- Thin Cup -- Thin Straw -- Puree -- Mechanical Soft -- Regular -- Multi-consistency -- Pill -- Cervical Esophageal Comment -- Osie Bond., M.A. [facility] Pager [phone] Office [phone] 01/03/2022, 9:38 AM                     ECHOCARDIOGRAM COMPLETE  Result Date: 01/02/2022    ECHOCARDIOGRAM REPORT   Patient Name:   Tayvin Preslar. Date of Exam: 01/02/2022 Medical Rec #:  [id number]        Height: Accession #:    [id number]       Weight: Date of Birth:  October 08, 1956        BSA: Patient Age:    22 years         BP:            117/90 mmHg Patient Gender: M                HR:           115 bpm. Exam Location:  Inpatient Procedure: 2D Echo, Cardiac Doppler and Color Doppler Indications:    stroke  History:        Patient has no prior history of Echocardiogram examinations.                 Risk Factors:Hypertension and Diabetes.  Sonographer:    Beryle Beams Referring Phys: [place]  1. Technically difficult study with poor visualization.  2. Left ventricular ejection fraction, by estimation, is 55 to 60%. The left ventricle has normal function. Left ventricular endocardial border not optimally defined to evaluate regional wall motion. There is mild concentric left ventricular hypertrophy. Left ventricular diastolic parameters are consistent with Grade I diastolic dysfunction (impaired relaxation).  3. Right ventricular systolic function is normal. The right ventricular size is normal.  4. The mitral valve is grossly normal. Trivial mitral valve regurgitation.  5. The aortic valve was not well visualized. Aortic valve regurgitation is not visualized. Aortic valve sclerosis is present, with no evidence of aortic valve stenosis. Comparison(s): No prior Echocardiogram. Conclusion(s)/Recommendation(s): No intracardiac source of embolism detected on this transthoracic study. Consider a transesophageal echocardiogram to exclude cardiac source of embolism if clinically indicated. FINDINGS  Left Ventricle: Left ventricular ejection fraction, by estimation, is 55 to 60%. The left ventricle has normal function. Left ventricular endocardial border not optimally defined to evaluate regional wall motion. The left ventricular internal cavity size was normal in size. There is mild concentric left ventricular hypertrophy. Left ventricular diastolic parameters are consistent with Grade I diastolic dysfunction (impaired relaxation). Right Ventricle: The right ventricular size is normal. Right vetricular wall thickness was not well  visualized. Right ventricular systolic function is normal. Left Atrium: Left atrial size was normal in size. Right Atrium: Right atrial size was normal in size. Pericardium: There is no evidence of pericardial effusion. Mitral Valve: The mitral valve is grossly normal. There is mild thickening of the mitral valve leaflet(s). There is mild calcification of the mitral valve leaflet(s). Mild mitral annular calcification.  Trivial mitral valve regurgitation. Tricuspid Valve: The tricuspid valve is normal in structure. Tricuspid valve regurgitation is trivial. Aortic Valve: The aortic valve was not well visualized. Aortic valve regurgitation is not visualized. Aortic valve sclerosis is present, with no evidence of aortic valve stenosis. Aortic valve mean gradient measures 3.0 mmHg. Aortic valve peak gradient measures 4.5 mmHg. Aortic valve area, by VTI measures 3.74 cm. Pulmonic Valve: The pulmonic valve was not well visualized. Aorta: The aortic root was not well visualized. Venous: The inferior vena cava was not well visualized. IAS/Shunts: The atrial septum is grossly normal.  LEFT VENTRICLE PLAX 2D LVIDd:         3.48 cm     Diastology LVIDs:         2.46 cm     LV e' medial:  8.08 cm/s LV PW:         1.05 cm     LV e' lateral: 9.48 cm/s LV IVS:        1.00 cm LVOT diam:     2.30 cm LV SV:         75 LVOT Area:     4.15 cm  LV Volumes (MOD) LV vol d, MOD A2C: 81.0 ml LV vol d, MOD A4C: 66.8 ml LV vol s, MOD A2C: 31.9 ml LV vol s, MOD A4C: 33.3 ml LV SV MOD A2C:     49.1 ml LV SV MOD A4C:     66.8 ml LV SV MOD BP:      42.3 ml RIGHT VENTRICLE RV S prime:     17.70 cm/s TAPSE (M-mode): 1.6 cm LEFT ATRIUM           RIGHT ATRIUM LA diam:      3.20 cm RA Area:     12.10 cm LA Vol (A4C): 17.6 ml  AORTIC VALVE AV Area (Vmax):    3.56 cm AV Area (Vmean):   3.62 cm AV Area (VTI):     3.74 cm AV Vmax:           106.00 cm/s AV Vmean:          74.300 cm/s AV VTI:            0.201 m AV Peak Grad:      4.5 mmHg AV Mean Grad:       3.0 mmHg LVOT Vmax:         90.90 cm/s LVOT Vmean:        64.700 cm/s LVOT VTI:          0.181 m LVOT/AV VTI ratio: 0.90  AORTA Ao Root diam: 3.20 cm Ao Asc diam:  3.00 cm  SHUNTS Systemic VTI:  0.18 m Systemic Diam: 2.30 cm Gwyndolyn Kaufman MD Electronically signed by Gwyndolyn Kaufman MD Signature Date/Time: 01/02/2022/5:15:44 PM    Final        Medical Problem List and Plan: 1. Functional deficits secondary to right pontine infarct  -patient may *** shower  -ELOS/Goals: *** 2.  Antithrombotics: -DVT/anticoagulation:  Pharmaceutical: Lovenox  -antiplatelet therapy: Aspirin 81 mg, Plavix (21 days versus 3 months), then aspirin alone 3. Pain Management: Tylenol 4. Mood: LCSW to evaluate and provide support  -antipsychotic agents: n/a 5. Neuropsych: This patient is capable of making decisions on his own behalf. 6. Skin/Wound Care: Routine skin care checks 7. Fluids/Electrolytes/Nutrition: Routine Is and Os and follow-up chemistries 8: DM: Hgb A1c 13.7  --Semglee 30u q HS  --Novolog 2u q AC  --SSI meal coverage  9: Hyperlipidemia: continue Lipitor 80 mg daily 10:Tobacco use: continue nicotine patch, cessation counseling 11: Prostate cancer: is undergoing treatment with Xtandi (3 tabs daily). Has not been receiving this since admission. 12: Mild hypokalemia: follow-up serum potassium in AM 13: Dysphagia:     ***  Barbie Banner, PA-C 01/04/2022

## 2022-01-04 NOTE — Progress Notes (Signed)
Inpatient Rehabilitation Admissions Coordinator   [employer] [facility] [place] has denied CIR admit for he is out of network. He was diverted by helicopter on day of admission from [facility] due to weather to [place] then ground transport to [facility]. I met with patient at bedside and he is aware that his options would be AIR in [place] that is in network or home with [facility] or OP if he and his girlfriend feel he is at a level to go home. Acute team and TOC made aware. We will sign off at this time.  Danne Baxter, RN, MSN Rehab Admissions Coordinator [phone] 01/04/2022 4:09 PM

## 2022-01-04 NOTE — Progress Notes (Signed)
Physical Therapy Treatment Patient Details Name: George Reyes. MRN: [id number] DOB: 11-30-1956 Today's Date: 01/04/2022   History of Present Illness George Kutzer. is a 66 y.o. male who presented to the ED 1/3 after a fall and weakness while attempting to ambulate to the bathroom. 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 PMHx of prior stroke 10 years ago (with residual left sided weakness), HLD, IDDM II, prostate cancer, and HTN    PT Comments    Patient progressing with ambulation distance and with cues able to demonstrate improved posture and walker safety, though needed continued cues throughout.  Patient encouraged to stay in chair for lunch and agreeable.  Still limited with safety, ADL independence and needing more assist at times when fatigued.  PT will continue to follow acutely.  Remains appropriate for inpatient rehab prior to d/c home.    Recommendations for follow up therapy are one component of a multi-disciplinary discharge planning process, led by the attending physician.  Recommendations may be updated based on patient status, additional functional criteria and insurance authorization.  Follow Up Recommendations  Acute inpatient rehab (3hours/day)     Assistance Recommended at Discharge Frequent or constant Supervision/Assistance  Patient can return home with the following Help with stairs or ramp for entrance;Assistance with cooking/housework;A lot of help with walking and/or transfers;A lot of help with bathing/dressing/bathroom   Equipment Recommendations  Rolling walker (2 wheels)    Recommendations for Other Services       Precautions / Restrictions Precautions Precautions: Fall Precaution Comments: fecal incontinence     Mobility  Bed Mobility Overal bed mobility: Needs Assistance Bed Mobility: Supine to Sit     Supine to sit: HOB elevated;Min assist     General bed mobility comments: assist to lift trunk, cues for  technique with rail    Transfers Overall transfer level: Needs assistance Equipment used: Rolling walker (2 wheels) Transfers: Sit to/from Stand Sit to Stand: Min assist;From elevated surface           General transfer comment: assist up from EOB and bed height raised due to pt's height    Ambulation/Gait Ambulation/Gait assistance: Min assist Gait Distance (Feet): 120 Feet Assistive device: Rolling walker (2 wheels) Gait Pattern/deviations: Trunk flexed;Wide base of support;Shuffle;Decreased dorsiflexion - left;Decreased stride length;Step-through pattern       General Gait Details: assist for balance, occasional assist for walker with turns, obstacles; cues throughout for posture, proximity to walker   Stairs             Wheelchair Mobility    Modified Rankin (Stroke Patients Only) Modified Rankin (Stroke Patients Only) Pre-Morbid Rankin Score: Slight disability Modified Rankin: Moderately severe disability     Balance Overall balance assessment: Needs assistance Sitting-balance support: Feet supported Sitting balance-Leahy Scale: Fair     Standing balance support: Bilateral upper extremity supported;Reliant on assistive device for balance Standing balance-Leahy Scale: Poor Standing balance comment: Reliant on BUE support and external support                            Cognition Arousal/Alertness: Awake/alert Behavior During Therapy: WFL for tasks assessed/performed;Flat affect Overall Cognitive Status: Within Functional Limits for tasks assessed                                 General Comments: knew he was having  difficulty controlling stool/urine, appropriately frustrated he is less than able to take care of himself        Exercises      General Comments General comments (skin integrity, edema, etc.): patient soiled with fecal incontinence upon standing from bed; assisted to sink and assist to wash off while pt  supporting with hands on sink and wide BOS      Pertinent Vitals/Pain Pain Assessment: No/denies pain    Home Living                          Prior Function            PT Goals (current goals can now be found in the care plan section) Progress towards PT goals: Progressing toward goals    Frequency    Min 4X/week      PT Plan Current plan remains appropriate    Co-evaluation              AM-PAC PT "6 Clicks" Mobility   Outcome Measure  Help needed turning from your back to your side while in a flat bed without using bedrails?: A Little Help needed moving from lying on your back to sitting on the side of a flat bed without using bedrails?: A Little Help needed moving to and from a bed to a chair (including a wheelchair)?: A Lot Help needed standing up from a chair using your arms (e.g., wheelchair or bedside chair)?: A Little Help needed to walk in hospital room?: A Lot Help needed climbing 3-5 steps with a railing? : Total 6 Click Score: 14    End of Session Equipment Utilized During Treatment: Gait belt Activity Tolerance: Patient tolerated treatment well Patient left: in chair;with call bell/phone within reach;with chair alarm set   PT Visit Diagnosis: Unsteadiness on feet (R26.81);Muscle weakness (generalized) (M62.81)     Time: 0933-1000 PT Time Calculation (min) (ACUTE ONLY): 27 min  Charges:  $Gait Training: 8-22 mins $Therapeutic Activity: 8-22 mins                     Magda Kiel, PT Acute Rehabilitation Services [phone] Office:[phone] 01/04/2022    Reginia Naas 01/04/2022, 2:07 PM

## 2022-01-04 NOTE — Progress Notes (Addendum)
Subjective: The patient was seen at bedside during rounds this AM. He was able to work with physical therapy and walked up the hallway.  He states that his weakness has improved overall as well as his speech.  He reports eating a lot of his dinner last night (chicken, mashed potatoes, and vegetables).  While eating, he did not have difficulty swallowing.  No other complaints or concerns today.    Objective:  Vital signs in last 24 hours: Vitals:   01/03/22 1216 01/03/22 1953 01/03/22 2334 01/04/22 0316  BP: (!) 151/82 138/83 128/77 103/76  Pulse: 95 (!) 102 85 81  Resp: 19 18 18 18   Temp: 98.1 F (36.7 C) 98.7 F (37.1 C) 97.9 F (36.6 C) 97.8 F (36.6 C)  TempSrc: Oral Oral Oral Oral  SpO2: 98% 95% 96% 97%    Physical Exam Constitutional:      General: He is not in acute distress.    Appearance: Normal appearance.  HENT:     Head: Normocephalic and atraumatic.  Eyes:     Extraocular Movements:     Right eye: Normal extraocular motion.     Pupils: Pupils are equal, round, and reactive to light.  Cardiovascular:     Rate and Rhythm: Regular rate and rhythm.    Heart sounds: No murmur heard.   No friction rub. No gallop.  Pulmonary:     Effort: Pulmonary effort is normal.     Breath sounds: Normal breath sounds. No wheezing, rhonchi or rales.  Abdominal:     General: Abdomen is flat. There is no distension.     Palpations: Abdomen is soft.     Tenderness: There is no abdominal tenderness.  Skin:    General: Skin is warm and dry.  Neurological:     Mental Status: He is alert and oriented to person, place, and time.  Psychiatric:        Mood and Affect: Mood normal.        Behavior: Behavior normal.    Neurologic exam: Mental status: A&Ox3 Cranial Nerves:             II: PERRL             III, IV, VI: Extra-occular motions intact bilaterally             V, VII: Sensation intact in all 3 divisions, left facial droop             IX, X: Palate rises symmetrically              XI: Shoulder shrug normal bilaterally               XII: Tongue midline    Motor: Strength 5/5 in right upper extremity, 4/5 in LUE (improved from yesterday's exam). Bulk muscle and tone are normal.  Sensory: Light touch intact and symmetric bilaterally   Assessment/Plan:  Principal Problem:   CVA (cerebral vascular accident) ([place]) Active Problems:   Uncontrolled type 2 diabetes mellitus with hyperglycemia ([place])  #Acute Ischemic Stroke, right pontine #H/o prior CVA 10 years ago with residual left-sided weakness MRI brain on admit showed an acute infarct in the right pons as well as additional remote pontine infarcts.  Likely secondary to small vessel disease. Pt with other risk factors including current smoker, poorly controlled DM, HLD, prior stroke.  At this time, main concern prior to discharge to CIR is the patient's dysphagia given that the patient failed his swallow screen on admission  and also expelled some vomit after his barium swallow. He was subsequently placed on dysphagia 1 diet. The patient has been tolerating this well and his speech is more intelligible today. Will await repeat speech eval, but otherwise holding off on cortrak for now. Also waiting on insurance approval from CIR. - Appreciate PT/OT/ST/SLP recs - DAPT for a total of 3 weeks, followed by plavix alone - Atorvastatin 80 mg daily, will try and switch to crestor sprinkle - Long-term BP goal normotensive - Telemetry - Frequent neuro checks - Dysphagia 1 diet   #T2DM The patient has a history of type 2 diabetes for which he is on Lantus 20 units nightly at home. A1c of 13.7.  - Glargine 30 U nightly with SSI, AC  - Novolog 2 units TID - CBG monitoring  #Diabetic Foot Wound #Onychomycosis   Large callus over the plantar aspect of his right foot that he reports is chronic, does not appear infected but needs debridement.  - outpatient podiatry   #HLD - Continue atorvastatin as above, trying to  switch to crestor sprinkle   #Tobacco use - Nicotine patch  Prior to Admission Living Arrangement: Home Anticipated Discharge Location: CIR Dispo: Anticipated discharge pending insurance Auth for CIR and speech re-eval  Orvis Brill, MD 01/04/2022, 7:05 AM Pager: [phone]  After 5pm on weekdays and 1pm on weekends: On Call pager [phone]

## 2022-01-04 NOTE — Progress Notes (Signed)
Inpatient Rehabilitation Admissions Coordinator   I await insurance approval for possible Cir admit today.  Danne Baxter, RN, MSN Rehab Admissions Coordinator [phone] 01/04/2022 11:57 AM

## 2022-01-04 NOTE — Progress Notes (Signed)
CM spoke to patient and then called his SO. She prefers he have some rehab before coming home as she can not physically assist him. CM inquired about [place] [facility] and she felt this was too far. She asked about a facility closer to [facility]. CM was unable to find another IR closer than [place]. CM attempted to update the SO but had to leave a voicemail. Will need to f/u to see if she is ok with SNF closer to [facility] for his rehab.

## 2022-01-04 NOTE — Progress Notes (Signed)
Occupational Therapy Treatment Patient Details Name: George Reyes. MRN: [id number] DOB: 1956/03/01 Today's Date: 01/04/2022   History of present illness George Reyes. is a 66 y.o. male who presented to the ED 1/3 after a fall and weakness while attempting to ambulate to the bathroom. 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 PMHx of prior stroke 10 years ago (with residual left sided weakness), HLD, IDDM II, prostate cancer, and HTN   OT comments  Pt making progress with OT goals. This session, pt presented with increased stability and strength this session. He continues to need min A for most functional mobility and ADL's, due to pt fatiguing and balance decreasing, as well as poor coordination. Continuing to recommend AIR to maximize his independence. OT will continue to follow acutely.    Recommendations for follow up therapy are one component of a multi-disciplinary discharge planning process, led by the attending physician.  Recommendations may be updated based on patient status, additional functional criteria and insurance authorization.    Follow Up Recommendations  Acute inpatient rehab (3hours/day)    Assistance Recommended at Discharge Intermittent Supervision/Assistance  Patient can return home with the following  A lot of help with walking and/or transfers;Two people to help with walking and/or transfers;A lot of help with bathing/dressing/bathroom;Two people to help with bathing/dressing/bathroom;Direct supervision/assist for medications management;Assist for transportation;Help with stairs or ramp for entrance   Equipment Recommendations  BSC/3in1;Tub/shower seat;Wheelchair (measurements OT);Wheelchair cushion (measurements OT)    Recommendations for Other Services      Precautions / Restrictions Precautions Precautions: Fall Restrictions Weight Bearing Restrictions: No       Mobility Bed Mobility Overal bed mobility: Needs  Assistance Bed Mobility: Supine to Sit;Sit to Supine     Supine to sit: Min guard Sit to supine: Min assist   General bed mobility comments: Min A for BLE management    Transfers Overall transfer level: Needs assistance Equipment used: Rolling walker (2 wheels) Transfers: Sit to/from Stand Sit to Stand: Min assist           General transfer comment: assist up from EOB and bed height raised due to pt's height     Balance Overall balance assessment: Needs assistance Sitting-balance support: Feet supported Sitting balance-Leahy Scale: Fair     Standing balance support: Bilateral upper extremity supported;Reliant on assistive device for balance Standing balance-Leahy Scale: Poor Standing balance comment: Reliant on BUE support and external support                           ADL either performed or assessed with clinical judgement   ADL Overall ADL's : Needs assistance/impaired     Grooming: Min guard;Standing;Wash/dry hands;Wash/dry face;Oral care Grooming Details (indicate cue type and reason): completed at sink                 Toilet Transfer: Minimal assistance;Ambulation Toilet Transfer Details (indicate cue type and reason): ambulated to bathroom Toileting- Clothing Manipulation and Hygiene: Minimal assistance;Sitting/lateral lean;Sit to/from stand Toileting - Clothing Manipulation Details (indicate cue type and reason): from toilet     Functional mobility during ADLs: Min guard;Rolling walker (2 wheels) General ADL Comments: Pt with improved strength, continued difficulties with balance and coordination    Extremity/Trunk Assessment              Vision       Perception     Praxis  Cognition Arousal/Alertness: Awake/alert Behavior During Therapy: WFL for tasks assessed/performed;Flat affect Overall Cognitive Status: Within Functional Limits for tasks assessed                                 General Comments:  knew he was having difficulty controlling stool/urine, appropriately frustrated he is less than able to take care of himself          Exercises     Shoulder Instructions       General Comments      Pertinent Vitals/ Pain       Pain Assessment: No/denies pain Faces Pain Scale: No hurt  Home Living                                          Prior Functioning/Environment              Frequency  Min 2X/week        Progress Toward Goals  OT Goals(current goals can now be found in the care plan section)  Progress towards OT goals: Progressing toward goals  Acute Rehab OT Goals Patient Stated Goal: none stated OT Goal Formulation: With patient Time For Goal Achievement: 01/16/22 Potential to Achieve Goals: Good ADL Goals Pt Will Perform Grooming: with modified independence;standing Pt Will Perform Lower Body Bathing: with modified independence;sit to/from stand Pt Will Perform Lower Body Dressing: with modified independence;sit to/from stand Pt Will Transfer to Toilet: with modified independence;ambulating Additional ADL Goal #1: pt will indep verbalized at least 3 fall prevention strategies to apply in the home setting  Plan Discharge plan remains appropriate;Frequency remains appropriate    Co-evaluation                 AM-PAC OT "6 Clicks" Daily Activity     Outcome Measure   Help from another person eating meals?: A Little Help from another person taking care of personal grooming?: A Little Help from another person toileting, which includes using toliet, bedpan, or urinal?: A Lot Help from another person bathing (including washing, rinsing, drying)?: A Lot Help from another person to put on and taking off regular upper body clothing?: A Little Help from another person to put on and taking off regular lower body clothing?: A Lot 6 Click Score: 15    End of Session Equipment Utilized During Treatment: Gait belt;Rolling walker (2  wheels)  OT Visit Diagnosis: Unsteadiness on feet (R26.81);Other abnormalities of gait and mobility (R26.89);Muscle weakness (generalized) (M62.81);History of falling (Z91.81);Apraxia (R48.2);Hemiplegia and hemiparesis Hemiplegia - Right/Left: Left Hemiplegia - dominant/non-dominant: Non-Dominant Hemiplegia - caused by: Cerebral infarction   Activity Tolerance Patient tolerated treatment well   Patient Left in bed;with call bell/phone within reach   Nurse Communication Mobility status        Time: 5956-3875 OT Time Calculation (min): 23 min  Charges: OT General Charges $OT Visit: 1 Visit OT Treatments $Self Care/Home Management : 23-37 mins  Shanel Prazak H., OTR/L Acute Rehabilitation  Ashlee Bewley Elane Yolanda Bonine 01/04/2022, 6:39 PM

## 2022-01-04 NOTE — Progress Notes (Signed)
Inpatient Diabetes Program Recommendations  AACE/ADA: New Consensus Statement on Inpatient Glycemic Control   Target Ranges:  Prepandial:   less than 140 mg/dL      Peak postprandial:   less than 180 mg/dL (1-2 hours)      Critically ill patients:  140 - 180 mg/dL    Latest Reference Range & Units 01/03/22 11:49 01/03/22 16:57 01/03/22 21:28 01/04/22 06:12  Glucose-Capillary 70 - 99 mg/dL 321 (H) 258 (H) 205 (H) 159 (H)   Review of Glycemic Control  Diabetes history: DM2 Outpatient Diabetes medications: Lantus 10 units QPM Current orders for Inpatient glycemic control: Semglee 30 units QHS, Novolog 0-15 units TID with meals, Novolog 2 units TID with meals  Inpatient Diabetes Program Recommendations:    Insulin: Please consider increasing meal coverage to Novolog 5 units TID with meals.  Thanks, Barnie Alderman, RN, MSN, CDE Diabetes Coordinator Inpatient Diabetes Program [phone] (Team Pager from 8am to 5pm)

## 2022-01-05 DIAGNOSIS — I639 Cerebral infarction, unspecified: Secondary | ICD-10-CM | POA: Diagnosis not present

## 2022-01-05 LAB — BASIC METABOLIC PANEL
Anion gap: 9 (ref 5–15)
BUN: 22 mg/dL (ref 8–23)
CO2: 27 mmol/L (ref 22–32)
Calcium: 8.8 mg/dL — ABNORMAL LOW (ref 8.9–10.3)
Chloride: 101 mmol/L (ref 98–111)
Creatinine, Ser: 0.86 mg/dL (ref 0.61–1.24)
GFR, Estimated: >60 mL/min (ref >60)
Glucose, Bld: 172 mg/dL — ABNORMAL HIGH (ref 70–99)
Potassium: 3.7 mmol/L (ref 3.5–5.1)
Sodium: 137 mmol/L (ref 135–145)

## 2022-01-05 LAB — GLUCOSE, CAPILLARY
Glucose-Capillary: 161 mg/dL — ABNORMAL HIGH (ref 70–99)
Glucose-Capillary: 224 mg/dL — ABNORMAL HIGH (ref 70–99)
Glucose-Capillary: 178 mg/dL — ABNORMAL HIGH (ref 70–99)
Glucose-Capillary: 152 mg/dL — ABNORMAL HIGH (ref 70–99)

## 2022-01-05 LAB — CBC
HCT: 39 % (ref 39.0–52.0)
Hemoglobin: 12.8 g/dL — ABNORMAL LOW (ref 13.0–17.0)
MCH: 28.1 pg (ref 26.0–34.0)
MCHC: 32.8 g/dL (ref 30.0–36.0)
MCV: 85.7 fL (ref 80.0–100.0)
Platelets: 235 10*3/uL (ref 150–400)
RBC: 4.55 MIL/uL (ref 4.22–5.81)
RDW: 12.5 % (ref 11.5–15.5)
WBC: 7.5 10*3/uL (ref 4.0–10.5)
nRBC: 0 % (ref 0.0–0.2)

## 2022-01-05 NOTE — Hospital Course (Addendum)
-  Reports doing well. -Swallowing ok. -Same neurological deficit -Pending SNF?   Note about cancer doc input for xtandi See him first in the AM

## 2022-01-05 NOTE — Progress Notes (Addendum)
Subjective: The patient was seen at bedside during rounds this AM. He feels that his swallowing has improved since yesterday. His LE weakness feels about the same. Patient is aware that we are working with SW to find him a rehab facility closer to home in [place]. No other complaints or concerns at this time.    Objective:  Vital signs in last 24 hours: Vitals:   01/04/22 2052 01/04/22 2300 01/05/22 0416 01/05/22 0900  BP: 116/75 133/82 (!) 142/95 127/80  Pulse: 99 90 87 77  Resp: 18 18 20 20   Temp: 98.1 F (36.7 C) 98.4 F (36.9 C) 98.5 F (36.9 C) 98.4 F (36.9 C)  TempSrc: Oral Oral Oral Oral  SpO2: 100% 99% 98% 97%    Physical Exam Constitutional:      General: He is not in acute distress.    Appearance: Normal appearance.  HENT:     Head: Normocephalic and atraumatic.  Eyes:     Extraocular Movements:     Right eye: Normal extraocular motion.     Pupils: Pupils are equal, round, and reactive to light.  Cardiovascular:     Rate and Rhythm: Regular rate and rhythm.    Heart sounds: No murmur heard.   No friction rub. No gallop.  Pulmonary:     Effort: Pulmonary effort is normal.     Breath sounds: Normal breath sounds. No wheezing, rhonchi or rales.  Abdominal:     General: Abdomen is flat. There is no distension.     Palpations: Abdomen is soft.     Tenderness: There is no abdominal tenderness.  Skin:    General: Skin is warm and dry.  Neurological:     Mental Status: He is alert and oriented to person, place, and time.  Psychiatric:        Mood and Affect: Mood normal.        Behavior: Behavior normal.    Neurologic exam: Mental status: A&Ox3 Cranial Nerves:             II: PERRL             III, IV, VI: Extra-occular motions intact bilaterally             V, VII: Sensation intact in all 3 divisions             IX, X: Palate rises symmetrically             XII: Tongue midline    Motor: Strength 5/5 in BUE, strength 4+/5 in BLE (improved from exam  yesterday). Bulk muscle and tone are normal.  Sensory: Light touch intact and symmetric bilaterally   Assessment/Plan:  Principal Problem:   CVA (cerebral vascular accident) ([place]) Active Problems:   Uncontrolled type 2 diabetes mellitus with hyperglycemia ([place] [address])  #Acute Ischemic Stroke, right pontine #H/o prior CVA 10 years ago with residual left-sided weakness MRI brain on admit showed an acute infarct in the right pons as well as additional remote pontine infarcts.  Likely secondary to small vessel disease. Pt with other risk factors including current smoker, poorly controlled DM, HLD, prior stroke.  At this time, main concern prior to discharge to CIR is the patient's dysphagia given that the patient failed his swallow screen on admission and also expelled some vomit after his barium swallow. He was subsequently placed on dysphagia 1 diet. The patient has been tolerating this well and speech is more intelligible. Medically stable for DC, however insurance has denied admission to  CIR here. Will explore other options, including a SNF closer to his home in [place]. Patient is agreeable to this plan.  - Appreciate PT/OT/ST/SLP recs - DAPT for a total of 3 weeks, followed by plavix alone - Atorvastatin 80 mg daily - Long-term BP goal normotensive - Telemetry - Frequent neuro checks - Dysphagia 1 diet   #T2DM The patient has a history of type 2 diabetes for which he is on Lantus 20 units nightly at home. A1c of 13.7.  - Glargine 33 U nightly with SSI, AC  - Novolog 6 units TID - CBG monitoring  #Diabetic Foot Wound #Onychomycosis   Large callus over the plantar aspect of his right foot that he reports is chronic, does not appear infected but needs debridement.  - Outpatient podiatry   #HLD - Continue atorvastatin   #Tobacco use - Nicotine patch  Prior to Admission Living Arrangement: Home Anticipated Discharge Location: SNF vs IR in [place]: Anticipated discharge pending SNF vs IR  placement  Orvis Brill, MD 01/05/2022, 10:45 AM Pager: [phone]  After 5pm on weekdays and 1pm on weekends: On Call pager [phone]

## 2022-01-05 NOTE — TOC Progression Note (Signed)
Transition of Care (TOC) - Progression Note    Patient Details  Name: George Reyes. MRN: [id number] Date of Birth: 10-23-1956  Transition of Care [facility]) CM/SW [place], [place] Phone Number: 01/05/2022, 2:55 PM  Clinical Narrative:     Weekday CM attempted to update pt's SO on barriers to IR in [place], and to discuss possible SNF placement. CSW left a VM and will continue to follow.       Expected Discharge Plan and Services                                                 Social Determinants of Health (SDOH) Interventions    Readmission Risk Interventions No flowsheet data found.

## 2022-01-05 NOTE — Plan of Care (Signed)
°  Problem: Education: Goal: Knowledge of General Education information will improve Description: Including pain rating scale, medication(s)/side effects and non-pharmacologic comfort measures Outcome: Progressing   Problem: Nutrition: Goal: Dietary intake will improve Outcome: Progressing

## 2022-01-05 NOTE — Progress Notes (Signed)
Physical Therapy Treatment Patient Details Name: George Reyes. MRN: [id number] DOB: Jan 03, 1956 Today's Date: 01/05/2022   History of Present Illness Kristine Tiley. is a 66 y.o. male who presented to the ED 1/3 after a fall and weakness while attempting to ambulate to the bathroom. 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 Still with functional weakness of left side and decreased coordination. He reports having bowel and bladder incontinence as well.  Continue to recommend rehab at discharge.     Recommendations for follow up therapy are one component of a multi-disciplinary discharge planning process, led by the attending physician.  Recommendations may be updated based on patient status, additional functional criteria and insurance authorization.  Follow Up Recommendations  Acute inpatient rehab (3hours/day)     Assistance Recommended at Discharge Frequent or constant Supervision/Assistance  Patient can return home with the following Help with stairs or ramp for entrance;Assistance with cooking/housework;A lot of help with bathing/dressing/bathroom;A little help with walking and/or transfers   Equipment Recommendations  Rolling walker (2 wheels)    Recommendations for Other Services       Precautions / Restrictions Precautions Precautions: Fall Precaution Comments: fecal and urine incontinence Restrictions Weight Bearing Restrictions: No     Mobility  Bed Mobility Overal bed mobility: Needs Assistance Bed Mobility: Supine to Sit     Supine to sit: Min assist     General bed mobility comments: mina A for LLE and cues    Transfers Overall transfer level: Needs assistance Equipment used: Rolling walker (2 wheels) Transfers: Sit to/from Stand Sit to Stand: Min assist            General transfer comment: Requires some physical assist as well as VCS for safest technique and hand placement    Ambulation/Gait Ambulation/Gait assistance: Min assist Gait Distance (Feet): 40 Feet (Also walk 15 in room first and did standing at sink to wash hands and turning activities) Assistive device: Rolling walker (2 wheels) Gait Pattern/deviations: Trunk flexed;Wide base of support;Shuffle;Decreased dorsiflexion - left;Decreased stride length       General Gait Details: Cues for posture   Stairs             Wheelchair Mobility    Modified Rankin (Stroke Patients Only) Modified Rankin (Stroke Patients Only) Pre-Morbid Rankin Score: Slight disability Modified Rankin: Moderately severe disability     Balance Overall balance assessment: Needs assistance Sitting-balance support: Feet supported Sitting balance-Leahy Scale: Good     Standing balance support: Single extremity supported Standing balance-Leahy Scale: Fair Standing balance comment: Pt able to take one hand off walker                            Cognition Arousal/Alertness: Awake/alert Behavior During Therapy: WFL for tasks assessed/performed;Flat affect Overall Cognitive Status: Within Functional Limits for tasks assessed                                          Exercises General Exercises - Lower Extremity Ankle Circles/Pumps: AROM;Left;10 reps Long Arc Quad: AROM;Left;5 reps;Seated    General Comments General comments (skin integrity, edema, etc.): Family present at end of  session      Pertinent Vitals/Pain Pain Assessment: No/denies pain    Home Living                          Prior Function            PT Goals (current goals can now be found in the care plan section) Progress towards PT goals: Progressing toward goals    Frequency    Min 4X/week      PT Plan Current plan remains appropriate    Co-evaluation               AM-PAC PT "6 Clicks" Mobility   Outcome Measure  Help needed turning from your back to your side while in a flat bed without using bedrails?: A Little Help needed moving from lying on your back to sitting on the side of a flat bed without using bedrails?: A Little Help needed moving to and from a bed to a chair (including a wheelchair)?: A Little Help needed standing up from a chair using your arms (e.g., wheelchair or bedside chair)?: A Little Help needed to walk in hospital room?: A Little Help needed climbing 3-5 steps with a railing? : A Lot 6 Click Score: 17    End of Session Equipment Utilized During Treatment: Gait belt Activity Tolerance: Patient tolerated treatment well Patient left: in chair   PT Visit Diagnosis: Unsteadiness on feet (R26.81);Muscle weakness (generalized) (M62.81)     Time: 3888-2800 PT Time Calculation (min) (ACUTE ONLY): 31 min  Charges:  $Gait Training: 23-37 mins                     Lavonia Dana, PT   Acute Rehabilitation Services  Pager [phone] Office [phone] 01/05/2022    Melvern Banker 01/05/2022, 2:45 PM

## 2022-01-06 DIAGNOSIS — I639 Cerebral infarction, unspecified: Secondary | ICD-10-CM | POA: Diagnosis not present

## 2022-01-06 LAB — BASIC METABOLIC PANEL
Anion gap: 8 (ref 5–15)
BUN: 22 mg/dL (ref 8–23)
CO2: 27 mmol/L (ref 22–32)
Calcium: 8.9 mg/dL (ref 8.9–10.3)
Chloride: 101 mmol/L (ref 98–111)
Creatinine, Ser: 0.97 mg/dL (ref 0.61–1.24)
GFR, Estimated: >60 mL/min (ref >60)
Glucose, Bld: 132 mg/dL — ABNORMAL HIGH (ref 70–99)
Potassium: 3.9 mmol/L (ref 3.5–5.1)
Sodium: 136 mmol/L (ref 135–145)

## 2022-01-06 LAB — GLUCOSE, CAPILLARY
Glucose-Capillary: 150 mg/dL — ABNORMAL HIGH (ref 70–99)
Glucose-Capillary: 261 mg/dL — ABNORMAL HIGH (ref 70–99)
Glucose-Capillary: 132 mg/dL — ABNORMAL HIGH (ref 70–99)
Glucose-Capillary: 187 mg/dL — ABNORMAL HIGH (ref 70–99)

## 2022-01-06 MED ORDER — INSULIN ASPART 100 UNIT/ML IJ SOLN
8.0000 [IU] | Freq: Three times a day (TID) | INTRAMUSCULAR | Status: DC
  Administered 2022-01-06 – 2022-01-09 (×8): 8 [IU] via SUBCUTANEOUS

## 2022-01-06 NOTE — TOC Progression Note (Signed)
Transition of Care (TOC) - Progression Note    Patient Details  Name: George Reyes. MRN: [id number] Date of Birth: September 15, 1956  Transition of Care [facility] CM/SW [place], [place] Phone Number: 01/06/2022, 10:17 AM  Clinical Narrative:     CSW followed up with pt's significant other, Katharine Look, to discuss SNF rehab vs. IR in [place].  now agreeable to IR in [place], which she had previously stated was too far away. She stated pt had told her he would like to go there. CSW attempted to follow up with [facility] to send referral, was unable to on the weekend. TOC will continue to follow.       Expected Discharge Plan and Services                                                 Social Determinants of Health (SDOH) Interventions    Readmission Risk Interventions No flowsheet data found.

## 2022-01-06 NOTE — Progress Notes (Signed)
Subjective: The patient was seen at bedside during rounds this AM. Patient reports doing well today. States that his weakness and speech have improved. He is walking with a walker. Patient is aware that we are working with SW to find him a rehab facility closer to home in [place]. No other complaints or concerns at this time.   Objective: Vital signs in last 24 hours: Vitals:   01/05/22 1608 01/05/22 1956 01/05/22 2350 01/06/22 0354  BP: 118/80 105/81 114/71 139/68  Pulse: 81 85 84 82  Resp: 16 18 18 18   Temp: 98.6 F (37 C) 98.3 F (36.8 C) 97.6 F (36.4 C) 97.7 F (36.5 C)  TempSrc: Oral Oral Oral Axillary  SpO2: 98% 97% 93% 98%   Physical Exam Constitutional:      General: He is not in acute distress.    Appearance: Normal appearance.  HENT:     Head: Normocephalic and atraumatic.  Eyes:     Extraocular Movements:     Right eye: Normal extraocular motion.     Pupils: Pupils are equal, round, and reactive to light.  Cardiovascular:     Rate and Rhythm: Regular rate and rhythm.    Heart sounds: No murmur heard.   No friction rub. No gallop.  Pulmonary:     Effort: Pulmonary effort is normal.     Breath sounds: Normal breath sounds. No wheezing, rhonchi or rales.  Abdominal:     General: Abdomen is flat. There is no distension.     Palpations: Abdomen is soft.     Tenderness: There is no abdominal tenderness.  Skin:    General: Skin is warm and dry.  Neurological:     Mental Status: He is alert and oriented to person, place, and time.  Psychiatric:        Mood and Affect: Mood normal.        Behavior: Behavior normal.    Neurologic exam: Mental status: A&Ox3 Cranial Nerves:             II: PERRL             III, IV, VI: Extra-occular motions intact bilaterally             V, VII: Sensation intact in all 3 divisions             IX, X: Palate rises symmetrically             XII: Tongue midline    Motor: Strength 5/5 in BUE, strength 4+/5 in BLE. Bulk muscle and tone  are normal.  Sensory: Light touch intact and symmetric bilaterally   Assessment/Plan:  Principal Problem:   CVA (cerebral vascular accident) ([place]) Active Problems:   Uncontrolled type 2 diabetes mellitus with hyperglycemia ([place])  #Acute Ischemic Stroke, right pontine #H/o prior CVA 10 years ago with residual left-sided weakness MRI brain on admit showed an acute infarct in the right pons as well as additional remote pontine infarcts. Likely secondary to small vessel disease. Pt with other risk factors including current smoker, poorly controlled DM, HLD, prior stroke.  At this time, main concern prior to discharge to CIR is the patient's dysphagia given that the patient failed his swallow screen on admission and also expelled some vomit after his barium swallow. He was subsequently placed on dysphagia 1 diet. The patient has been tolerating this well and speech is more intelligible. Medically stable for DC, however insurance has denied admission to CIR here. Will explore other options,  seeing if IR in [place], [place] is an option. Patient is agreeable to this plan.  - Appreciate PT/OT/ST/SLP recs - DAPT for a total of 3 weeks, followed by plavix alone - Atorvastatin 80 mg daily - Long-term BP goal normotensive - Telemetry - Frequent neuro checks - Dysphagia 1 diet   #T2DM The patient has a history of type 2 diabetes for which he is on Lantus 20 units nightly at home. A1c of 13.7.  - Glargine 33 U nightly with SSI, AC  - Novolog 6 units TID - CBG monitoring  #Diabetic Foot Wound #Onychomycosis   Large callus over the plantar aspect of his right foot that he reports is chronic, does not appear infected but needs debridement.  - Outpatient podiatry   #HLD - Continue atorvastatin   #Tobacco use - Nicotine patch  Prior to Admission Living Arrangement: Home Anticipated Discharge Location: IR in [place]: Anticipated discharge pending placement  Orvis Brill, MD 01/06/2022,  6:23 AM Pager: [phone]  After 5pm on weekdays and 1pm on weekends: On Call pager [phone]

## 2022-01-07 DIAGNOSIS — I639 Cerebral infarction, unspecified: Secondary | ICD-10-CM | POA: Diagnosis not present

## 2022-01-07 LAB — GLUCOSE, CAPILLARY
Glucose-Capillary: 141 mg/dL — ABNORMAL HIGH (ref 70–99)
Glucose-Capillary: 170 mg/dL — ABNORMAL HIGH (ref 70–99)
Glucose-Capillary: 258 mg/dL — ABNORMAL HIGH (ref 70–99)

## 2022-01-07 NOTE — TOC Progression Note (Addendum)
Transition of Care (TOC) - Progression Note    Patient Details  Name: George Reyes. MRN: [id number] Date of Birth: 02/06/1956  Transition of Care [facility]) CM/SW Contact  Pollie Friar, RN Phone Number: 01/07/2022, 11:42 AM  Clinical Narrative:    CM has spoken to [facility] and faxed them the required information. They will review the referral and update CM. Pt may also need insurance auth.  TOC following.  1510: [place] IR asked that pt come with his [facility] once he gets insurance auth. CM has updated the MD.         Expected Discharge Plan and Services                                                 Social Determinants of Health (SDOH) Interventions    Readmission Risk Interventions No flowsheet data found.

## 2022-01-07 NOTE — Progress Notes (Signed)
Subjective: The patient was seen at bedside during rounds this AM. Patient reports doing well today. States that his weakness and speech have improved. Patient is aware that we are working to find him a rehab facility closer to home in [place]. No other complaints or concerns at this time.   Objective: Vital signs in last 24 hours: Vitals:   01/06/22 1644 01/06/22 2102 01/07/22 0025 01/07/22 0357  BP: 137/74 (!) 144/74 132/77 127/79  Pulse: 82 89 77 79  Resp: 19 20 18 20   Temp: 98.3 F (36.8 C) 98.6 F (37 C) 97.9 F (36.6 C) 98.6 F (37 C)  TempSrc: Oral Oral Oral Oral  SpO2: 98% 98% 95% 97%   Physical Exam Constitutional:      General: He is not in acute distress.    Appearance: Normal appearance.  HENT:     Head: Normocephalic and atraumatic.  Eyes:     Extraocular Movements:     Right eye: Normal extraocular motion.     Pupils: Pupils are equal, round, and reactive to light.  Cardiovascular:     Rate and Rhythm: Regular rate and rhythm.    Heart sounds: No murmur heard.   No friction rub. No gallop.  Pulmonary:     Effort: Pulmonary effort is normal.     Breath sounds: Normal breath sounds. No wheezing, rhonchi or rales.  Abdominal:     General: Abdomen is flat. There is no distension.     Palpations: Abdomen is soft.     Tenderness: There is no abdominal tenderness.  Skin:    General: Skin is warm and dry.  Neurological:     Mental Status: He is alert and oriented to person, place, and time.  Psychiatric:        Mood and Affect: Mood normal.        Behavior: Behavior normal.    Neurologic exam: Mental status: A&Ox3 Cranial Nerves:             II: PERRL             III, IV, VI: Extra-occular motions intact bilaterally             V, VII: Sensation intact in all 3 divisions             IX, X: Palate rises symmetrically             XII: Tongue midline    Motor: Strength 5/5 in BUE, strength 4+/5 in BLE. Bulk muscle and tone are normal.  Sensory: Light touch  intact and symmetric bilaterally   Assessment/Plan:  Principal Problem:   CVA (cerebral vascular accident) ([place]) Active Problems:   Uncontrolled type 2 diabetes mellitus with hyperglycemia ([place])  #Acute Ischemic Stroke, right pontine #H/o prior CVA 10 years ago with residual left-sided weakness MRI brain on admit showed an acute infarct in the right pons as well as additional remote pontine infarcts. Likely secondary to small vessel disease. Pt with other risk factors including current smoker, poorly controlled DM, HLD, prior stroke.  At this time, main concern prior to discharge to CIR was the patient's dysphagia given that the patient failed his swallow screen on admission and also expelled some vomit after his barium swallow. He was subsequently placed on dysphagia 1 diet. The patient has been tolerating this well and speech is more intelligible today. Medically stable for DC, however insurance has denied admission to CIR here. PT is continuing to recommend IR. Will explore other options,  seeing if IR in [place], [place] is an option. Patient is agreeable to this plan.  - Appreciate PT/OT/ST/SLP recs - DAPT for a total of 3 weeks, followed by plavix alone - Atorvastatin 80 mg daily - Long-term BP goal normotensive - Neuro checks - Dysphagia 1 diet   #T2DM The patient has a history of type 2 diabetes for which he is on Lantus 20 units nightly at home. A1c of 13.7.  - Glargine 33 U nightly with SSI, AC  - Novolog 8 units TID - CBG monitoring  #Diabetic Foot Wound #Onychomycosis   Large callus over the plantar aspect of his right foot that he reports is chronic, does not appear infected but needs debridement.  - Outpatient podiatry   #HLD - Continue atorvastatin   #Tobacco use - Nicotine patch  Prior to Admission Living Arrangement: Home Anticipated Discharge Location: IR in [place]: Anticipated discharge pending placement  Orvis Brill, MD 01/07/2022, 6:27 AM Pager:  [phone]  After 5pm on weekdays and 1pm on weekends: On Call pager [phone]

## 2022-01-07 NOTE — Progress Notes (Signed)
Physical Therapy Treatment Patient Details Name: George Reyes. MRN: [id number] DOB: [birth year] Today's Date: 01/07/2022   History of Present Illness Shakir Petrosino. is a 66 y.o. male who presented to the ED 1/3 after a fall and weakness while attempting to ambulate to the bathroom. 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  Patient needing cues for L foot progression and much decreased gait speed.  High risk for falls if home with S only so continue to recommend Acute inpatient rehab at d/c.     Recommendations for follow up therapy are one component of a multi-disciplinary discharge planning process, led by the attending physician.  Recommendations may be updated based on patient status, additional functional criteria and insurance authorization.  Follow Up Recommendations  Acute inpatient rehab (3hours/day)     Assistance Recommended at Discharge Frequent or constant Supervision/Assistance  Patient can return home with the following Help with stairs or ramp for entrance;Assistance with cooking/housework;A lot of help with bathing/dressing/bathroom;A little help with walking and/or transfers   Equipment Recommendations  Rolling walker (2 wheels)    Recommendations for Other Services       Precautions / Restrictions Precautions Precautions: Fall Precaution Comments: fecal and urine incontinence Restrictions Weight Bearing Restrictions: No     Mobility  Bed Mobility Overal bed mobility: Needs Assistance Bed Mobility: Supine to Sit     Supine to sit: HOB elevated;Min assist     General bed mobility comments: assist for L LE to EOB then  to scoot hips    Transfers Overall transfer level: Needs assistance Equipment used: Rolling walker (2 wheels) Transfers: Sit to/from Stand Sit to Stand: Min assist;From elevated surface           General transfer comment: assist up from EOB for balance, increased height due to pt's height    Ambulation/Gait Ambulation/Gait assistance: Min assist;Mod assist Gait Distance (Feet): 80 Feet Assistive device: Rolling walker (2 wheels) Gait Pattern/deviations: Step-to pattern;Step-through pattern;Trunk flexed;Wide base of support;Decreased step length - left;Decreased dorsiflexion - left;Knee flexed in stance - left       General Gait Details: cues for posture throughout and forward gaze, cues for L step length and foot clearance, assist for safety at times up to mod A due to L LE instability   Stairs             Wheelchair Mobility    Modified Rankin (Stroke Patients Only) Modified Rankin (Stroke Patients Only) Pre-Morbid Rankin Score: Slight disability Modified Rankin: Moderately severe disability     Balance Overall balance assessment: Needs assistance   Sitting balance-Leahy Scale: Good     Standing balance support: Single extremity supported;During functional activity Standing balance-Leahy Scale: Poor Standing balance comment: assist for balance with standing to wash bottom due to soiled with BM and needing at least 1 UE support due to trunk sinking lower when attempting two handed task (washing hands)                            Cognition Arousal/Alertness: Awake/alert   Overall Cognitive Status: No family/caregiver present to determine  baseline cognitive functioning Area of Impairment: Following commands;Problem solving                       Following Commands: Follows one step commands consistently;Follows one step commands with increased time;Follows multi-step commands with increased time     Problem Solving: Slow  processing;Difficulty sequencing General Comments: slow processing and increased time for all mobility        Exercises      General Comments        Pertinent Vitals/Pain Pain Assessment: No/denies pain    Home Living                          Prior Function            PT Goals (current goals can now be found in the care plan section) Progress towards PT goals: Progressing toward goals    Frequency    Min 4X/week      PT Plan Current plan remains appropriate    Co-evaluation              AM-PAC PT "6 Clicks" Mobility   Outcome Measure  Help needed turning from your back to your side while in a flat bed without using bedrails?: A Little Help needed moving from lying on your back to sitting on the side of a flat bed without using bedrails?: A Little Help needed moving to and from a bed to a chair (including a wheelchair)?: A Little Help needed standing up from a chair using your arms (e.g., wheelchair or bedside chair)?: A Little Help needed to walk in hospital room?: A Lot Help needed climbing 3-5 steps with a railing? : Total 6 Click Score: 15    End of Session Equipment Utilized During Treatment: Gait belt Activity Tolerance: Patient tolerated treatment well Patient left: in chair;with call bell/phone within reach;with chair alarm set   PT Visit Diagnosis: Unsteadiness on feet (R26.81);Muscle weakness (generalized) (M62.81)     Time: 1030-1055 PT Time Calculation (min) (ACUTE ONLY): 25 min  Charges:  $Gait Training: 8-22 mins $Therapeutic Activity: 8-22 mins                     Magda Kiel, PT Acute Rehabilitation Services Pager:[phone] [phone] 01/07/2022    Reginia Naas 01/07/2022, 11:02 AM

## 2022-01-08 ENCOUNTER — Other Ambulatory Visit (HOSPITAL_COMMUNITY): Payer: Self-pay

## 2022-01-08 DIAGNOSIS — I639 Cerebral infarction, unspecified: Secondary | ICD-10-CM | POA: Diagnosis not present

## 2022-01-08 LAB — RESP PANEL BY RT-PCR (FLU A&B, COVID) ARPGX2
Influenza A by PCR: NEGATIVE
Influenza B by PCR: NEGATIVE
SARS Coronavirus 2 by RT PCR: NEGATIVE

## 2022-01-08 LAB — GLUCOSE, CAPILLARY
Glucose-Capillary: 136 mg/dL — ABNORMAL HIGH (ref 70–99)
Glucose-Capillary: 256 mg/dL — ABNORMAL HIGH (ref 70–99)
Glucose-Capillary: 133 mg/dL — ABNORMAL HIGH (ref 70–99)
Glucose-Capillary: 150 mg/dL — ABNORMAL HIGH (ref 70–99)
Glucose-Capillary: 156 mg/dL — ABNORMAL HIGH (ref 70–99)

## 2022-01-08 MED ORDER — XTANDI 40 MG PO CAPS
120.0000 mg | ORAL_CAPSULE | Freq: Every day | ORAL | 0 refills | Status: DC
  Filled 2022-01-08: qty 90, 30d supply, fill #0

## 2022-01-08 MED ORDER — XTANDI 40 MG PO CAPS
120.0000 mg | ORAL_CAPSULE | Freq: Every day | ORAL | 0 refills | Status: DC
  Filled 2022-01-08: qty 90, 30d supply, fill #0
  Filled 2022-01-08: qty 270, 90d supply, fill #0

## 2022-01-08 NOTE — Progress Notes (Signed)
Occupational Therapy Treatment Patient Details Name: George Reyes. MRN: [id number] DOB: 1956/09/09 Today's Date: 01/08/2022   History of present illness George Reyes. is a 66 y.o. male who presented to the ED 1/3 after a fall and weakness while attempting to ambulate to the bathroom. 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 PMHx of prior stroke 10 years ago (with residual left sided weakness), HLD, IDDM II, prostate cancer, and HTN   OT comments  George Reyes is progressing well towards his acute OT goals. He was able to transfer and ambulate with RW given up to min A this session, 1 L knee buckle noted that required min A to correct. Pt was able to sequence simple 1 step task, but required min A verbal cues to sequence a 3 step grooming task. Pt requires at least 1UE support in standing for balance and stability, and has a posterior lean when unsupported. PT required sitting rest break after standing ~2 minutes at the sink. D/c remains appropriate, OT to continue to follow acutely.    Recommendations for follow up therapy are one component of a multi-disciplinary discharge planning process, led by the attending physician.  Recommendations may be updated based on patient status, additional functional criteria and insurance authorization.    Follow Up Recommendations  Acute inpatient rehab (3hours/day)    Assistance Recommended at Discharge Intermittent Supervision/Assistance  Patient can return home with the following  A lot of help with walking and/or transfers;Two people to help with walking and/or transfers;A lot of help with bathing/dressing/bathroom;Two people to help with bathing/dressing/bathroom;Direct supervision/assist for medications management;Assist for transportation;Help with stairs or ramp for entrance   Equipment Recommendations  BSC/3in1;Tub/shower seat;Wheelchair (measurements OT);Wheelchair cushion (measurements OT)    Recommendations for Other  Services Rehab consult    Precautions / Restrictions Precautions Precautions: Fall Restrictions Weight Bearing Restrictions: No       Mobility Bed Mobility Overal bed mobility: Needs Assistance Bed Mobility: Supine to Sit;Sit to Supine     Supine to sit: Min assist Sit to supine: Min assist   General bed mobility comments: min A to problem solve, initiate LE toward EOB and to elevate trunk. min A to initiate BLE back into bed as well    Transfers Overall transfer level: Needs assistance Equipment used: Rolling walker (2 wheels) Transfers: Sit to/from Stand Sit to Stand: Min guard;Min assist           General transfer comment: min A from lower surface, min G from elevated surface     Balance Overall balance assessment: Needs assistance Sitting-balance support: Feet supported Sitting balance-Leahy Scale: Good     Standing balance support: Single extremity supported;During functional activity Standing balance-Leahy Scale: Poor Standing balance comment: posterior lean and melting in standing with bimanual task - requires at least 1UE supported on external surface                           ADL either performed or assessed with clinical judgement   ADL Overall ADL's : Needs assistance/impaired     Grooming: Minimal assistance;Standing Grooming Details (indicate cue type and reason): min A for verbal cues to locate at the sink. pt with 1 L knee buckle Upper Body Bathing: Supervision/ safety;Sitting Upper Body Bathing Details (indicate cue type and reason): attempted in standing however pt required UE support for safety. completed in sitting.  Toilet Transfer: Minimal assistance;Ambulation;Rolling walker (2 wheels) Toilet Transfer Details (indicate cue type and reason): min A for 1 L knee buckle with min A to correct         Functional mobility during ADLs: Minimal assistance;Rolling walker (2 wheels) General ADL Comments: pt given 3  tasks to complete, he was able to sequence 1/3 and required verbal cues to go through the rest. limited by impair balance, cognition and poor activity tolerance this date    Extremity/Trunk Assessment Upper Extremity Assessment LUE Deficits / Details: impaired AROM, and globally 4/5 MMT throughout LUE. deminished sensation reported in distal LUE, ulnar aspect>radial. Able to use funcitonally with increased time and effort LUE Sensation: decreased light touch LUE Coordination: decreased fine motor;decreased gross motor   Lower Extremity Assessment Lower Extremity Assessment: Defer to PT evaluation        Vision   Vision Assessment?: Vision impaired- to be further tested in functional context Additional Comments: verbal cues to locate 1/3 grooming items at the sink   Perception Perception Perception: Within Functional Limits       Cognition Arousal/Alertness: Awake/alert Behavior During Therapy: WFL for tasks assessed/performed;Flat affect Overall Cognitive Status: No family/caregiver present to determine baseline cognitive functioning Area of Impairment: Following commands;Problem solving                       Following Commands: Follows one step commands consistently;Follows multi-step commands inconsistently     Problem Solving: Slow processing;Difficulty sequencing General Comments: slow processing and difficulty following multi-step commands. Requires cues to sequence 3 step trail making. Fair insigh to deficits as pt was explaining it was difficult to move yesterday, and he is "more safe" today                General Comments VSS on RA - gave pt nectar thick liquid at the end of the session    Pertinent Vitals/ Pain       Pain Assessment: No/denies pain   Frequency  Min 2X/week        Progress Toward Goals  OT Goals(current goals can now be found in the care plan section)  Progress towards OT goals: Progressing toward goals  Acute Rehab OT  Goals Patient Stated Goal: home with god-son OT Goal Formulation: With patient Time For Goal Achievement: 01/16/22 Potential to Achieve Goals: Good ADL Goals Pt Will Perform Grooming: with modified independence;standing Pt Will Perform Lower Body Bathing: with modified independence;sit to/from stand Pt Will Perform Lower Body Dressing: with modified independence;sit to/from stand Pt Will Transfer to Toilet: with modified independence;ambulating Additional ADL Goal #1: pt will indep verbalized at least 3 fall prevention strategies to apply in the home setting  Plan Discharge plan remains appropriate;Frequency remains appropriate       AM-PAC OT "6 Clicks" Daily Activity     Outcome Measure   Help from another person eating meals?: A Little Help from another person taking care of personal grooming?: A Little Help from another person toileting, which includes using toliet, bedpan, or urinal?: A Lot Help from another person bathing (including washing, rinsing, drying)?: A Lot Help from another person to put on and taking off regular upper body clothing?: A Little Help from another person to put on and taking off regular lower body clothing?: A Lot 6 Click Score: 15    End of Session Equipment Utilized During Treatment: Gait belt;Rolling walker (2 wheels)  OT Visit Diagnosis: Unsteadiness on feet (R26.81);Other abnormalities of gait and  mobility (R26.89);Muscle weakness (generalized) (M62.81);History of falling (Z91.81);Apraxia (R48.2);Hemiplegia and hemiparesis Hemiplegia - Right/Left: Left Hemiplegia - dominant/non-dominant: Non-Dominant Hemiplegia - caused by: Cerebral infarction   Activity Tolerance Patient tolerated treatment well   Patient Left in bed;with call bell/phone within reach;with bed alarm set   Nurse Communication Mobility status        Time: 1355-1420 OT Time Calculation (min): 25 min  Charges: OT General Charges $OT Visit: 1 Visit OT Treatments $Self  Care/Home Management : 23-37 mins   Kiano Terrien A Rhoda Waldvogel 01/08/2022, 2:31 PM

## 2022-01-08 NOTE — Progress Notes (Signed)
Subjective: The patient was seen at bedside during rounds this AM. Patient reports doing well today. States that his weakness and speech have improved. Patient is aware that we are working to find him a rehab facility closer to home in [place]. No other complaints or concerns at this time.   Objective: Vital signs in last 24 hours: Vitals:   01/08/22 0736 01/08/22 1133 01/08/22 1135 01/08/22 1352  BP: 123/78  117/65   Pulse: 73  84   Resp: 18 14 14    Temp: 97.8 F (36.6 C) 98.9 F (37.2 C) 98.9 F (37.2 C)   TempSrc: Oral Oral Oral   SpO2: 97%  97%   Weight:    81.8 kg  Height:    6\' 2"  (1.88 m)   Physical Exam Constitutional:      General: He is not in acute distress.    Appearance: Normal appearance.  HENT:     Head: Normocephalic and atraumatic.  Eyes:     Extraocular Movements:     Right eye: Normal extraocular motion.     Pupils: Pupils are equal, round, and reactive to light.  Cardiovascular:     Rate and Rhythm: Regular rate and rhythm.    Heart sounds: No murmur heard.   No friction rub. No gallop.  Pulmonary:     Effort: Pulmonary effort is normal.     Breath sounds: Normal breath sounds. No wheezing, rhonchi or rales.  Abdominal:     General: Abdomen is flat. There is no distension.     Palpations: Abdomen is soft.     Tenderness: There is no abdominal tenderness.  Skin:    General: Skin is warm and dry.  Neurological:     Mental Status: He is alert and oriented to person, place, and time.  Psychiatric:        Mood and Affect: Mood normal.        Behavior: Behavior normal.    Neurologic exam: Mental status: A&Ox3 Cranial Nerves:             II: PERRL             III, IV, VI: Extra-occular motions intact bilaterally             V, VII: Sensation intact in all 3 divisions, symmetric smile             IX, X: Palate rises symmetrically             XII: Tongue midline    Motor: Strength 5/5 in BUE, strength 4/5 in BLE. Bulk muscle and tone are normal.   Sensory: Light touch intact and symmetric bilaterally   Assessment/Plan:  Principal Problem:   CVA (cerebral vascular accident) ([place]) Active Problems:   Uncontrolled type 2 diabetes mellitus with hyperglycemia ([place])  #Acute Ischemic Stroke, right pontine #H/o prior CVA 10 years ago with residual left-sided weakness MRI brain on admit showed an acute infarct in the right pons as well as additional remote pontine infarcts. Likely secondary to small vessel disease. Pt with other risk factors including current smoker, poorly controlled DM, HLD, prior stroke. The patient has been tolerating Dysphagia 1 diet and continues to produce intelligible speech. Medically stable for DC, however insurance has denied admission to CIR here. PT is continuing to recommend IR. Pending insurance auth for [employer] Patient is agreeable to this plan.  - Appreciate PT/OT/ST/SLP recs - DAPT for a total of 3 weeks, followed by plavix alone - Atorvastatin  80 mg daily - Long-term BP goal normotensive - Neuro checks - Dysphagia 1 diet   #T2DM The patient has a history of type 2 diabetes for which he is on Lantus 20 units nightly at home. A1c of 13.7.  - Glargine 33 U nightly with SSI, AC  - Novolog 8 units TID - CBG monitoring  #Diabetic Foot Wound #Onychomycosis  Large callus over the plantar aspect of his right foot that he reports is chronic, does not appear infected but needs debridement.  - Outpatient podiatry   #HLD - Continue atorvastatin   #Tobacco use - Nicotine patch  Prior to Admission Living Arrangement: Home Anticipated Discharge Location: IR in [place]: Anticipated discharge pending insurance auth for [facility]  Orvis Brill, MD 01/08/2022, 2:19 PM Pager: [phone]  After 5pm on weekdays and 1pm on weekends: On Call pager [phone]

## 2022-01-08 NOTE — Progress Notes (Signed)
Physical Therapy Treatment Patient Details Name: George Reyes. MRN: [id number] DOB: 03/19/1956 Today's Date: 01/08/2022   History of Present Illness George Reyes. is a 66 y.o. male who presented to the ED 1/3 after a fall and weakness while attempting to ambulate to the bathroom. 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 PMHx of prior stroke 10 years ago (with residual left sided weakness), HLD, IDDM II, prostate cancer, and HTN    PT Comments    Patient progressing with mobility and able to walk further than last session and progress with strengthening tasks on L LE.  Patient appropriate for acute inpatient rehab to further progress LE strength, balance, safety and cognition.  PT will continue to follow acutely.   Recommendations for follow up therapy are one component of a multi-disciplinary discharge planning process, led by the attending physician.  Recommendations may be updated based on patient status, additional functional criteria and insurance authorization.  Follow Up Recommendations  Acute inpatient rehab (3hours/day)     Assistance Recommended at Discharge Frequent or constant Supervision/Assistance  Patient can return home with the following Help with stairs or ramp for entrance;Assistance with cooking/housework;A lot of help with bathing/dressing/bathroom;A little help with walking and/or transfers   Equipment Recommendations  Rolling walker (2 wheels)    Recommendations for Other Services       Precautions / Restrictions Precautions Precautions: Fall Precaution Comments: fecal and urine incontinence Restrictions Weight Bearing Restrictions: No     Mobility  Bed Mobility Overal bed mobility: Needs Assistance Bed Mobility: Supine to Sit     Supine to sit: Min assist Sit to supine: Min assist   General bed mobility comments: increased time, assist for initiation and trunk lifting    Transfers Overall transfer level: Needs  assistance Equipment used: Rolling walker (2 wheels) Transfers: Sit to/from Stand Sit to Stand: Min assist           General transfer comment: min A from bed at lower setting    Ambulation/Gait Ambulation/Gait assistance: Min assist Gait Distance (Feet): 120 Feet Assistive device: Rolling walker (2 wheels) Gait Pattern/deviations: Step-to pattern;Step-through pattern;Trunk flexed;Wide base of support;Decreased step length - left;Decreased dorsiflexion - left;Knee flexed in stance - left       General Gait Details: cues for posture, assist for balance, safety with turns   Stairs             Wheelchair Mobility    Modified Rankin (Stroke Patients Only) Modified Rankin (Stroke Patients Only) Pre-Morbid Rankin Score: Slight disability Modified Rankin: Moderately severe disability     Balance Overall balance assessment: Needs assistance Sitting-balance support: Feet supported Sitting balance-Leahy Scale: Good     Standing balance support: Bilateral upper extremity supported Standing balance-Leahy Scale: Poor Standing balance comment: UE support for balance in standing                            Cognition Arousal/Alertness: Awake/alert Behavior During Therapy: WFL for tasks assessed/performed;Flat affect Overall Cognitive Status: No family/caregiver present to determine baseline cognitive functioning Area of Impairment: Following commands;Problem solving                       Following Commands: Follows one step commands consistently;Follows multi-step commands inconsistently     Problem Solving: Slow processing;Difficulty sequencing General Comments: slow processing and difficulty following multi-step commands. Requires cues to sequence 3 step trail making.  Fair insigh to deficits as pt was explaining it was difficult to move yesterday, and he is "more safe" today        Exercises Other Exercises Other Exercises: supine single leg  bridge on L x 10 Other Exercises: hooklying marches x 10    General Comments General comments (skin integrity, edema, etc.): VSS on RA      Pertinent Vitals/Pain Pain Assessment: No/denies pain    Home Living                          Prior Function            PT Goals (current goals can now be found in the care plan section) Progress towards PT goals: Progressing toward goals    Frequency    Min 4X/week      PT Plan Current plan remains appropriate    Co-evaluation              AM-PAC PT "6 Clicks" Mobility   Outcome Measure  Help needed turning from your back to your side while in a flat bed without using bedrails?: A Little Help needed moving from lying on your back to sitting on the side of a flat bed without using bedrails?: A Little Help needed moving to and from a bed to a chair (including a wheelchair)?: A Little Help needed standing up from a chair using your arms (e.g., wheelchair or bedside chair)?: A Little Help needed to walk in hospital room?: A Little Help needed climbing 3-5 steps with a railing? : Total 6 Click Score: 16    End of Session Equipment Utilized During Treatment: Gait belt Activity Tolerance: Patient tolerated treatment well Patient left: in chair;with call bell/phone within reach;with chair alarm set   PT Visit Diagnosis: Unsteadiness on feet (R26.81);Muscle weakness (generalized) (M62.81);Hemiplegia and hemiparesis Hemiplegia - Right/Left: Left Hemiplegia - dominant/non-dominant: Non-dominant     Time: 1130-1155 PT Time Calculation (min) (ACUTE ONLY): 25 min  Charges:  $Gait Training: 8-22 mins $Therapeutic Exercise: 8-22 mins                     Magda Kiel, PT Acute Rehabilitation Services [phone] Office:[phone] 01/08/2022    Reginia Naas 01/08/2022, 4:23 PM

## 2022-01-08 NOTE — Plan of Care (Signed)
Pt rested during overnight. No prns given. Denies pain. Pt had 1 bm and 1 smear. Primafit changed to condom cath this am due to leaking. NIH 3. Bath given this am and full linen change  Problem: Education: Goal: Knowledge of General Education information will improve Description: Including pain rating scale, medication(s)/side effects and non-pharmacologic comfort measures Outcome: Progressing   Problem: Health Behavior/Discharge Planning: Goal: Ability to manage health-related needs will improve Outcome: Progressing   Problem: Clinical Measurements: Goal: Ability to maintain clinical measurements within normal limits will improve Outcome: Progressing Goal: Will remain free from infection Outcome: Progressing Goal: Diagnostic test results will improve Outcome: Progressing Goal: Respiratory complications will improve Outcome: Progressing Goal: Cardiovascular complication will be avoided Outcome: Progressing   Problem: Activity: Goal: Risk for activity intolerance will decrease Outcome: Progressing   Problem: Nutrition: Goal: Adequate nutrition will be maintained Outcome: Progressing   Problem: Coping: Goal: Level of anxiety will decrease Outcome: Progressing   Problem: Elimination: Goal: Will not experience complications related to bowel motility Outcome: Progressing Goal: Will not experience complications related to urinary retention Outcome: Progressing   Problem: Pain Managment: Goal: General experience of comfort will improve Outcome: Progressing   Problem: Safety: Goal: Ability to remain free from injury will improve Outcome: Progressing   Problem: Skin Integrity: Goal: Risk for impaired skin integrity will decrease Outcome: Progressing   Problem: Education: Goal: Knowledge of disease or condition will improve Outcome: Progressing Goal: Knowledge of secondary prevention will improve (SELECT ALL) Outcome: Progressing Goal: Knowledge of patient specific risk  factors will improve (INDIVIDUALIZE FOR PATIENT) Outcome: Progressing Goal: Individualized Educational Video(s) Outcome: Progressing   Problem: Coping: Goal: Will verbalize positive feelings about self Outcome: Progressing Goal: Will identify appropriate support needs Outcome: Progressing   Problem: Health Behavior/Discharge Planning: Goal: Ability to manage health-related needs will improve Outcome: Progressing   Problem: Self-Care: Goal: Ability to participate in self-care as condition permits will improve Outcome: Progressing Goal: Verbalization of feelings and concerns over difficulty with self-care will improve Outcome: Progressing Goal: Ability to communicate needs accurately will improve Outcome: Progressing   Problem: Nutrition: Goal: Risk of aspiration will decrease Outcome: Progressing Goal: Dietary intake will improve Outcome: Progressing   Problem: Ischemic Stroke/TIA Tissue Perfusion: Goal: Complications of ischemic stroke/TIA will be minimized Outcome: Progressing

## 2022-01-08 NOTE — TOC Progression Note (Signed)
Transition of Care (TOC) - Progression Note    Patient Details  Name: George Reyes. MRN: [id number] Date of Birth: Aug 09, 1956  Transition of Care [facility]) CM/SW Contact  Pollie Friar, RN Phone Number: 01/08/2022, 5:05 PM  Clinical Narrative:    CM received word that pt has received insurance authorization for [facility] admission. CM has updated the patient, MD and his SO: Katharine Look. Covid ordered for tomorrow admission. [facility] to f/u with patients oncologist on the [place] and have it shipped to [facility] if the oncologist wants it continued. MD updated.  TOC following.        Expected Discharge Plan and Services                                                 Social Determinants of Health (SDOH) Interventions    Readmission Risk Interventions No flowsheet data found.

## 2022-01-08 NOTE — Progress Notes (Shared)
Subjective: The patient was seen at bedside during rounds this AM. Patient reports doing well today. States that his weakness and speech have improved. Patient is aware that we are working to find him a rehab facility closer to home in [place]. No other complaints or concerns at this time.   Objective: Vital signs in last 24 hours: Vitals:   01/07/22 1932 01/08/22 0023 01/08/22 0518 01/08/22 0736  BP: 134/79 132/72 136/87 123/78  Pulse: 86 88 78 73  Resp: 18 15 17 18   Temp: 98.6 F (37 C) 97.8 F (36.6 C) (!) 97.5 F (36.4 C) 97.8 F (36.6 C)  TempSrc: Oral Oral Oral Oral  SpO2: 97% 96% 96% 97%   Physical Exam Constitutional:      General: He is not in acute distress.    Appearance: Normal appearance.  HENT:     Head: Normocephalic and atraumatic.  Eyes:     Extraocular Movements:     Right eye: Normal extraocular motion.     Pupils: Pupils are equal, round, and reactive to light.  Cardiovascular:     Rate and Rhythm: Regular rate and rhythm.    Heart sounds: No murmur heard.   No friction rub. No gallop.  Pulmonary:     Effort: Pulmonary effort is normal.     Breath sounds: Normal breath sounds. No wheezing, rhonchi or rales.  Abdominal:     General: Abdomen is flat. There is no distension.     Palpations: Abdomen is soft.     Tenderness: There is no abdominal tenderness.  Skin:    General: Skin is warm and dry.  Neurological:     Mental Status: He is alert and oriented to person, place, and time.  Psychiatric:        Mood and Affect: Mood normal.        Behavior: Behavior normal.    Neurologic exam: Mental status: A&Ox3 Cranial Nerves:             II: PERRL             III, IV, VI: Extra-occular motions intact bilaterally             V, VII: Sensation intact in all 3 divisions             IX, X: Palate rises symmetrically             XII: Tongue midline    Motor: Strength 5/5 in BUE, strength 4+/5 in BLE. Bulk muscle and tone are normal.  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 Likely secondary to small vessel disease. Pt with other risk factors including current smoker, poorly controlled DM, HLD, prior stroke.  At this time, main concern prior to discharge to CIR was the patient's dysphagia given that the patient failed his swallow screen on admission and also expelled some vomit after his barium swallow. He was subsequently placed on dysphagia 1 diet. The patient has been tolerating this well and speech is more intelligible today. Medically stable for DC, however insurance has denied admission  to CIR here. PT is continuing to recommend IR. Will explore other options, seeing if IR in [place], [place] is an option. Patient is agreeable to this plan.  - Appreciate PT/OT/ST/SLP recs - DAPT for a total of 3 weeks, followed by plavix alone - Atorvastatin 80 mg daily - Long-term BP goal normotensive - Neuro checks - Dysphagia 1 diet   #T2DM The patient has a history of type 2 diabetes for which he is on Lantus 20 units nightly at home. A1c of 13.7.  - Glargine 33 U nightly with SSI, AC  - Novolog 8 units TID - CBG monitoring  #Diabetic Foot Wound #Onychomycosis   Large callus over the plantar aspect of his right foot that he reports is chronic, does not appear infected but needs debridement.  - Outpatient podiatry   #HLD - Continue atorvastatin   #Tobacco use - Nicotine patch  Prior to Admission Living Arrangement: Home Anticipated Discharge Location: IR in [place]: Anticipated discharge pending  placement  Orvis Brill, MD 01/08/2022, 8:38 AM Pager: [phone]  After 5pm on weekdays and 1pm on weekends: On Call pager [phone]

## 2022-01-09 DIAGNOSIS — E1165 Type 2 diabetes mellitus with hyperglycemia: Secondary | ICD-10-CM | POA: Diagnosis not present

## 2022-01-09 DIAGNOSIS — I639 Cerebral infarction, unspecified: Secondary | ICD-10-CM | POA: Diagnosis not present

## 2022-01-09 LAB — GLUCOSE, CAPILLARY: Glucose-Capillary: 119 mg/dL — ABNORMAL HIGH (ref 70–99)

## 2022-01-09 MED ORDER — ACETAMINOPHEN 160 MG/5ML PO SOLN: 650.0000 mg | ORAL | 0 refills | Status: AC | PRN

## 2022-01-09 MED ORDER — NICOTINE 21 MG/24HR TD PT24: 21.0000 mg | MEDICATED_PATCH | Freq: Every day | TRANSDERMAL | 0 refills | Status: AC

## 2022-01-09 MED ORDER — ATORVASTATIN CALCIUM 80 MG PO TABS: 80.0000 mg | ORAL_TABLET | Freq: Every day | ORAL | Status: AC

## 2022-01-09 MED ORDER — SENNOSIDES-DOCUSATE SODIUM 8.6-50 MG PO TABS: 1.0000 | ORAL_TABLET | Freq: Every evening | ORAL | Status: AC | PRN

## 2022-01-09 MED ORDER — ENOXAPARIN SODIUM 40 MG/0.4ML IJ SOSY: 40.0000 mg | PREFILLED_SYRINGE | INTRAMUSCULAR | Status: AC

## 2022-01-09 MED ORDER — ASPIRIN 81 MG PO CHEW: 81.0000 mg | CHEWABLE_TABLET | Freq: Every day | ORAL | Status: AC

## 2022-01-09 MED ORDER — CLOPIDOGREL BISULFATE 75 MG PO TABS: 75.0000 mg | ORAL_TABLET | Freq: Every day | ORAL | Status: AC

## 2022-01-09 MED ORDER — INSULIN GLARGINE-YFGN 100 UNIT/ML ~~LOC~~ SOLN: 33.0000 [IU] | Freq: Every day | SUBCUTANEOUS | 11 refills | Status: AC

## 2022-01-09 MED ORDER — FOOD THICKENER (SIMPLYTHICK HONEY): 10.0000 | ORAL | Status: AC | PRN

## 2022-01-09 MED ORDER — ACETAMINOPHEN 650 MG RE SUPP
650.0000 mg | RECTAL | 0 refills | Status: AC | PRN
Start: 2022-01-09 — End: ?

## 2022-01-09 MED ORDER — ACETAMINOPHEN 325 MG PO TABS: 650.0000 mg | ORAL_TABLET | ORAL | Status: AC | PRN

## 2022-01-09 MED ORDER — INSULIN ASPART 100 UNIT/ML IJ SOLN: 8.0000 [IU] | Freq: Three times a day (TID) | INTRAMUSCULAR | 11 refills | Status: AC

## 2022-01-09 MED ORDER — INSULIN ASPART 100 UNIT/ML IJ SOLN: 0.0000 [IU] | Freq: Three times a day (TID) | INTRAMUSCULAR | 11 refills | Status: AC

## 2022-01-09 NOTE — TOC Transition Note (Signed)
Transition of Care [facility]) - CM/SW Discharge Note   Patient Details  Name: George Reyes. MRN: [id number] Date of Birth: [birth year]  Transition of Care [facility]) CM/SW Contact:  Pollie Friar, RN Phone Number: 01/09/2022, 8:25 AM   Clinical Narrative:    Patient is discharging to [facility] inpatient rehab. CM has updated the patient, his SO and the bedside RN. CM has talked to the facility and verified bed and address. Will arranged ambulance transportation.   Number for report: [phone]  Final next level of care: IP Rehab Facility Barriers to Discharge: No Barriers Identified   Patient Goals and CMS Choice     Choice offered to / list presented to : Patient  Discharge Placement                       Discharge Plan and Services                                     Social Determinants of Health (SDOH) Interventions     Readmission Risk Interventions No flowsheet data found.

## 2022-01-09 NOTE — Plan of Care (Signed)
°  Problem: Education: Goal: Knowledge of disease or condition will improve Outcome: Progressing Goal: Knowledge of secondary prevention will improve (SELECT ALL) Outcome: Progressing   Problem: Coping: Goal: Will verbalize positive feelings about self Outcome: Progressing   Problem: Health Behavior/Discharge Planning: Goal: Ability to manage health-related needs will improve Outcome: Progressing   Problem: Self-Care: Goal: Ability to participate in self-care as condition permits will improve Outcome: Progressing   Problem: Ischemic Stroke/TIA Tissue Perfusion: Goal: Complications of ischemic stroke/TIA will be minimized Outcome: Progressing

## 2022-01-09 NOTE — Plan of Care (Signed)
°  Problem: Education: Goal: Knowledge of General Education information will improve Description: Including pain rating scale, medication(s)/side effects and non-pharmacologic comfort measures Outcome: Adequate for Discharge   Problem: Health Behavior/Discharge Planning: Goal: Ability to manage health-related needs will improve Outcome: Adequate for Discharge   Problem: Clinical Measurements: Goal: Ability to maintain clinical measurements within normal limits will improve Outcome: Adequate for Discharge Goal: Will remain free from infection Outcome: Adequate for Discharge Goal: Diagnostic test results will improve Outcome: Adequate for Discharge Goal: Respiratory complications will improve Outcome: Adequate for Discharge Goal: Cardiovascular complication will be avoided Outcome: Adequate for Discharge   Problem: Activity: Goal: Risk for activity intolerance will decrease Outcome: Adequate for Discharge   Problem: Nutrition: Goal: Adequate nutrition will be maintained Outcome: Adequate for Discharge   Problem: Coping: Goal: Level of anxiety will decrease Outcome: Adequate for Discharge   Problem: Elimination: Goal: Will not experience complications related to bowel motility Outcome: Adequate for Discharge Goal: Will not experience complications related to urinary retention Outcome: Adequate for Discharge   Problem: Pain Managment: Goal: General experience of comfort will improve Outcome: Adequate for Discharge   Problem: Safety: Goal: Ability to remain free from injury will improve Outcome: Adequate for Discharge   Problem: Skin Integrity: Goal: Risk for impaired skin integrity will decrease Outcome: Adequate for Discharge   Problem: Education: Goal: Knowledge of disease or condition will improve Outcome: Adequate for Discharge Goal: Knowledge of secondary prevention will improve (SELECT ALL) Outcome: Adequate for Discharge Goal: Knowledge of patient specific  risk factors will improve (INDIVIDUALIZE FOR PATIENT) Outcome: Adequate for Discharge Goal: Individualized Educational Video(s) Outcome: Adequate for Discharge   Problem: Coping: Goal: Will verbalize positive feelings about self Outcome: Adequate for Discharge Goal: Will identify appropriate support needs Outcome: Adequate for Discharge   Problem: Health Behavior/Discharge Planning: Goal: Ability to manage health-related needs will improve Outcome: Adequate for Discharge   Problem: Self-Care: Goal: Ability to participate in self-care as condition permits will improve Outcome: Adequate for Discharge Goal: Verbalization of feelings and concerns over difficulty with self-care will improve Outcome: Adequate for Discharge Goal: Ability to communicate needs accurately will improve Outcome: Adequate for Discharge   Problem: Nutrition: Goal: Risk of aspiration will decrease Outcome: Adequate for Discharge Goal: Dietary intake will improve Outcome: Adequate for Discharge   Problem: Ischemic Stroke/TIA Tissue Perfusion: Goal: Complications of ischemic stroke/TIA will be minimized Outcome: Adequate for Discharge

## 2022-01-09 NOTE — Progress Notes (Signed)
Patient discharged to [facility] inpatient rehab, transported by [facility] services via stretcher. Discharge packet given to PT[facility] staff, report called to Damian Leavell RN @ [facility] inpatient rehab.   Munachimso Rigdon, Tivis Ringer, RN

## 2022-01-11 ENCOUNTER — Other Ambulatory Visit (HOSPITAL_COMMUNITY): Payer: Self-pay

## 2022-02-04 ENCOUNTER — Other Ambulatory Visit (HOSPITAL_COMMUNITY): Payer: Self-pay

## 2023-01-24 IMAGING — MR MR HEAD WO/W CM
7 of 13 series · 23 of 48 positions shown · IV contrast (gadavist)
Comparison: Same day CT head.

CLINICAL DATA: Neuro deficit, acute, stroke suspected

EXAM:
MRI HEAD WITHOUT AND WITH CONTRAST
TECHNIQUE: Multiplanar, multiecho pulse sequences of the brain and surrounding
structures were obtained without and with intravenous contrast.
CONTRAST:  8.5mL GADAVIST GADOBUTROL 1 MMOL/ML IV SOLN

[Series 2: DWI · axial · 3.0mm · 0.94mm/px · z∈[-111,+40]mm · 8 of 104 slices shown (1 of 2)]
[im 1/104]
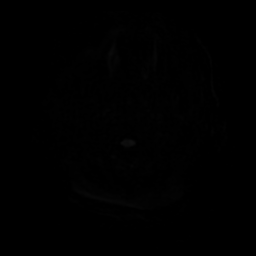
[im 15/104]
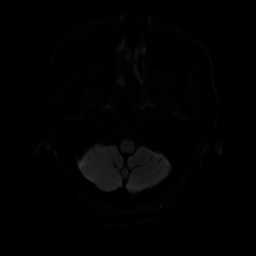
[im 30/104]
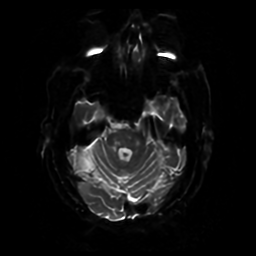
[im 45/104]
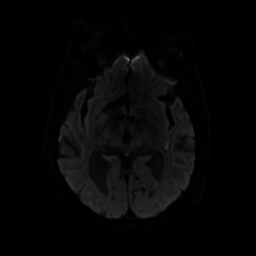
[im 59/104]
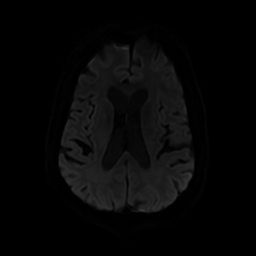
[im 74/104]
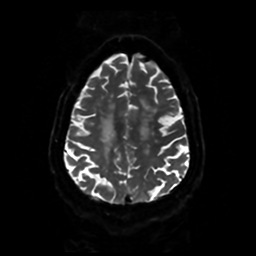
[im 89/104]
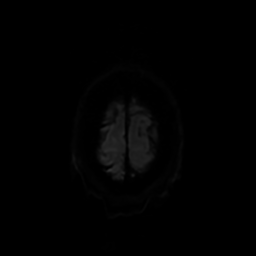
[im 104/104]
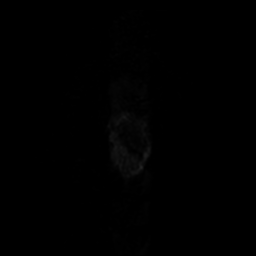

[Series 3: DWI · coronal · 4.0mm · 0.94mm/px · 5 of 76 slices shown (2 of 2)]
[im 1/76]
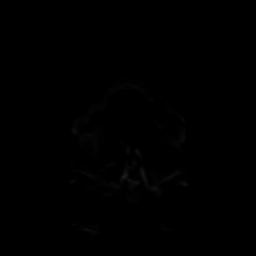
[im 19/76]
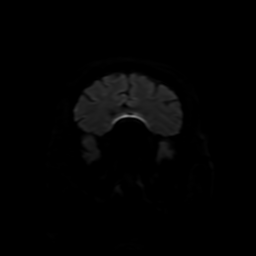
[im 38/76]
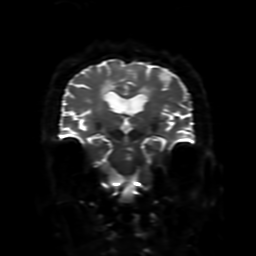
[im 57/76]
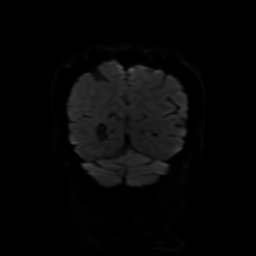
[im 76/76]
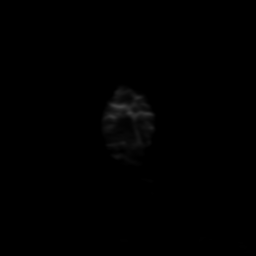

[Series 4: FLAIR · sagittal · 5.0mm · 0.23mm/px · 2 of 27 slices shown (1 of 2)]
[im 1/27]
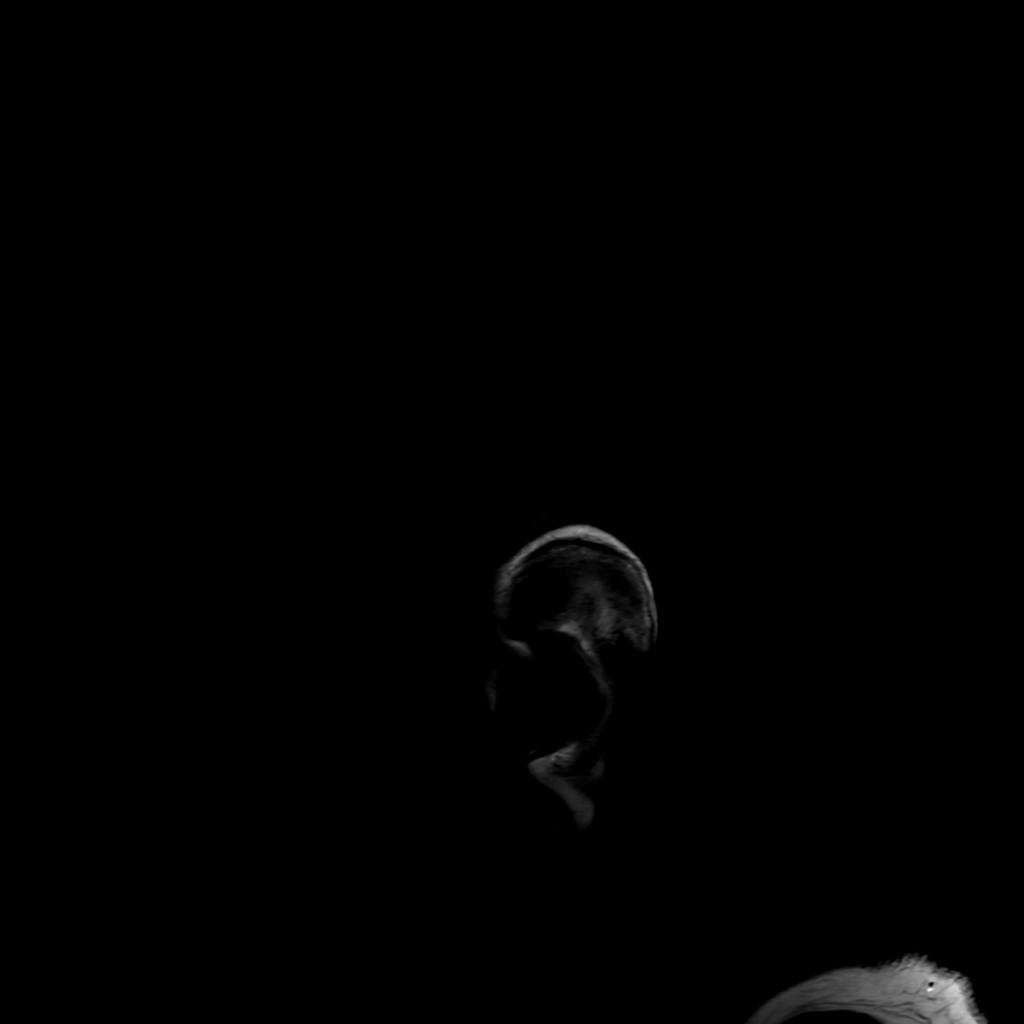
[im 27/27]
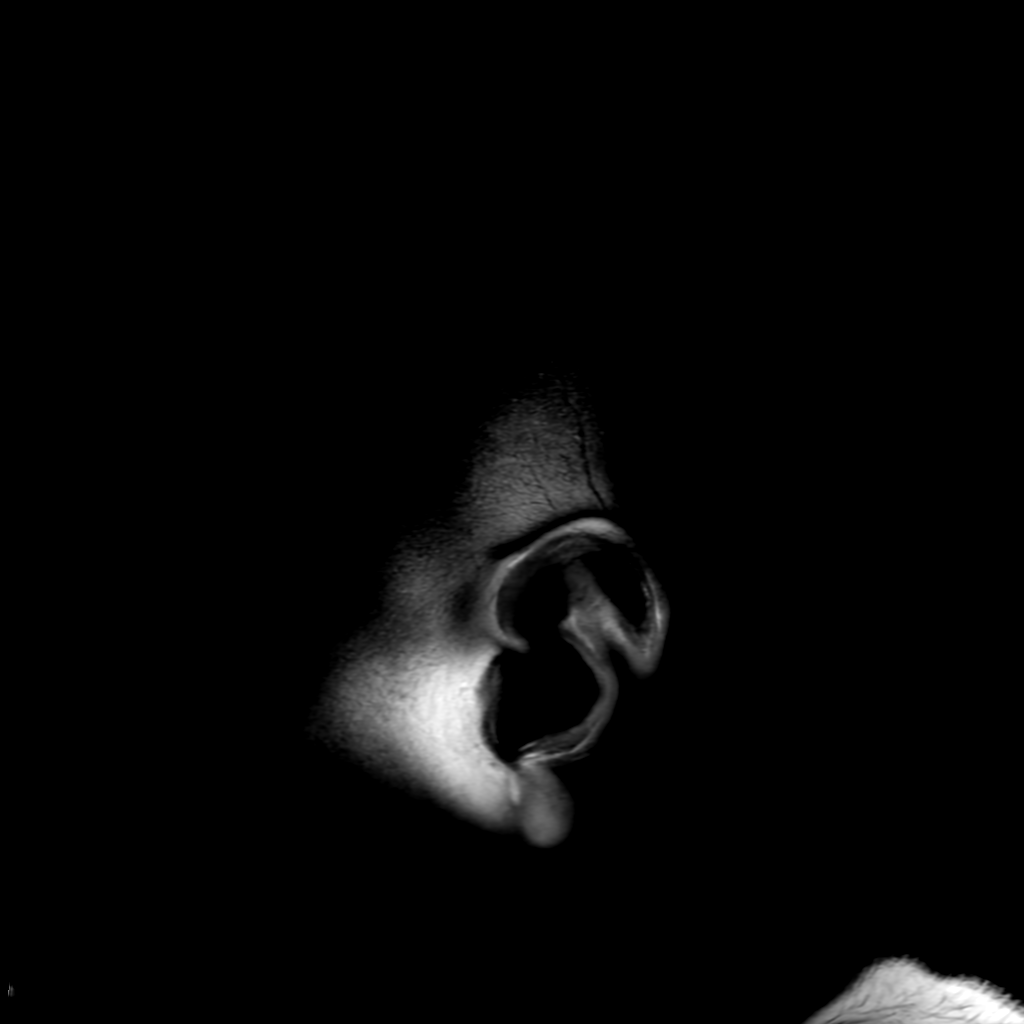

[Series 5: T2 · axial · 5.0mm · 0.23mm/px · 1 of 27 slices shown]
[im 1/27]
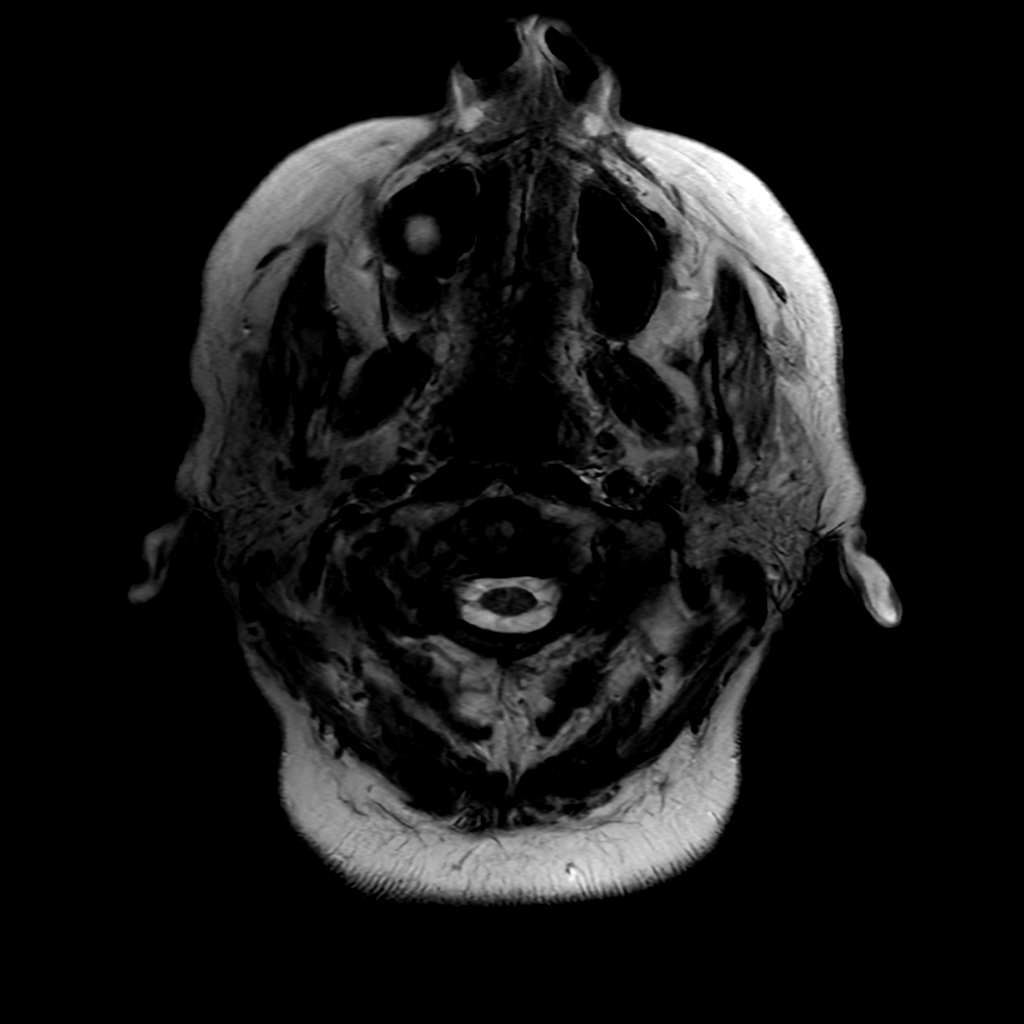

[Series 6: FLAIR · axial · 4.0mm · 0.45mm/px · z∈[-98,+55]mm · 2 of 36 slices shown (2 of 2)]
[im 1/36]
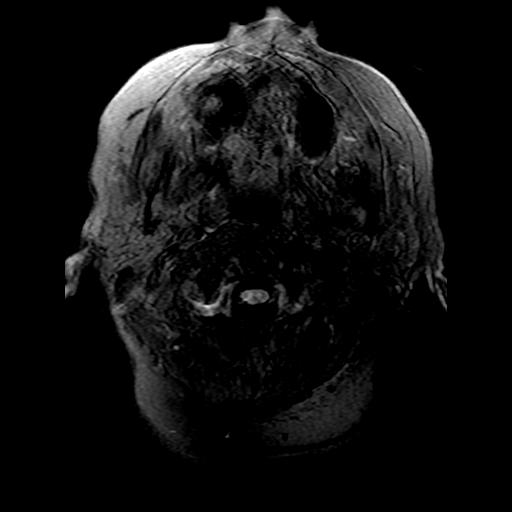
[im 36/36]
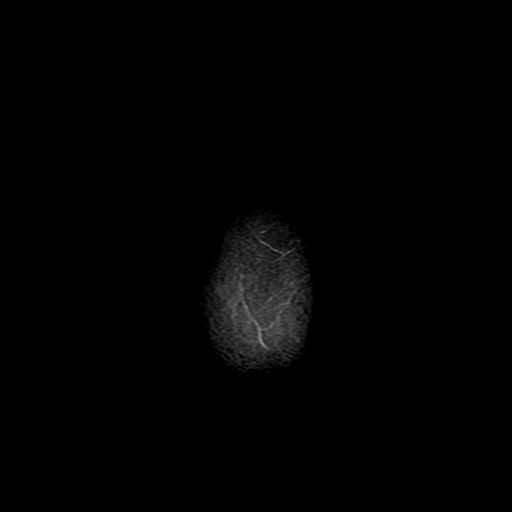

[Series 250: ADC · axial · 3.0mm · 0.94mm/px · z∈[-111,+40]mm · 3 of 53 slices shown (1 of 2)]
[im 1/53]
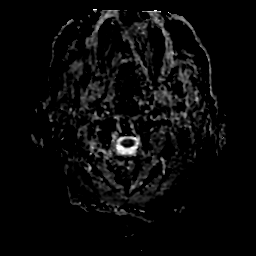
[im 27/53]
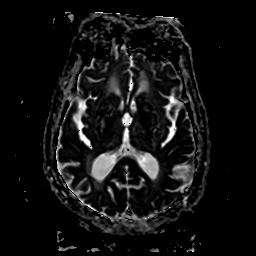
[im 53/53]
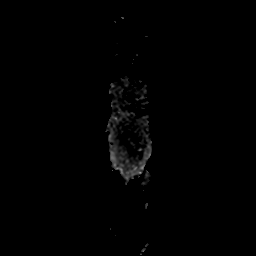

[Series 350: ADC · coronal · 4.0mm · 0.94mm/px · 2 of 37 slices shown (2 of 2)]
[im 1/37]
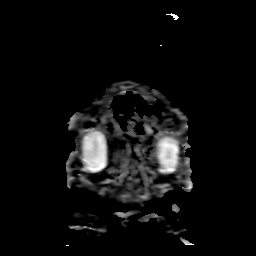
[im 37/37]
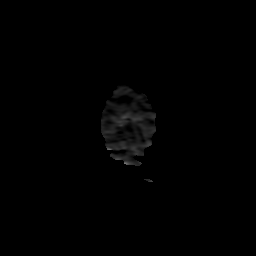

[23 of 48 positions shown; findings below may reference images not displayed]

FINDINGS: Brain: Acute infarct in the right pons. Slight edema without mass
effect. Additional patchy T2/FLAIR hyperintensity within the
supratentorial and pontine white matter, nonspecific but compatible
with chronic microvascular ischemic disease. Cerebral atrophy with
ex vacuo ventricular dilation. Remote pontine infarcts. Multiple
small foci of susceptibility artifact in bilateral basal ganglia,
left thalamus and pons, likely chronic microhemorrhages. No evidence
of acute hemorrhage. No hydrocephalus, extra-axial fluid collection,
mass lesion or midline shift. No abnormal enhancement

Vascular: Major arterial flow voids are maintained at the skull
base.

Skull and upper cervical spine: Normal marrow signal.

Sinuses/Orbits: Minimal paranasal sinus mucosal thickening.
Unremarkable orbits.

Other: No mastoid effusions.
IMPRESSION: 1. Acute infarct in the right pons. Slight edema without mass
effect.
2. Additional remote pontine infarcts, moderate chronic
microvascular ischemic disease, and cerebral atrophy ([id number]).
3. Multiple chronic microhemorrhages, described above and likely
secondary to chronic hypertension.

## 2023-08-31 DEATH — deceased
